# Patient Record
Sex: Female | Born: 1975 | Race: White | Hispanic: No | Marital: Married | Smoking: Never smoker
Health system: Southern US, Community
[De-identification: ages and names within clinical notes are randomized; demographics above are authoritative.]

## PROBLEM LIST (undated history)

## (undated) DIAGNOSIS — C439 Malignant melanoma of skin, unspecified: Secondary | ICD-10-CM

## (undated) DIAGNOSIS — F319 Bipolar disorder, unspecified: Secondary | ICD-10-CM

## (undated) DIAGNOSIS — E039 Hypothyroidism, unspecified: Secondary | ICD-10-CM

## (undated) HISTORY — PX: METATARSAL OSTEOTOMY WITH BUNIONECTOMY: SHX5662

## (undated) HISTORY — DX: Malignant melanoma of skin, unspecified: C43.9

---

## 1997-10-26 ENCOUNTER — Other Ambulatory Visit: Admission: RE | Admit: 1997-10-26 | Discharge: 1997-10-26 | Payer: Self-pay | Admitting: Gynecology

## 1998-11-16 ENCOUNTER — Other Ambulatory Visit: Admission: RE | Admit: 1998-11-16 | Discharge: 1998-11-16 | Payer: Self-pay | Admitting: Gynecology

## 1999-11-23 ENCOUNTER — Other Ambulatory Visit: Admission: RE | Admit: 1999-11-23 | Discharge: 1999-11-23 | Payer: Self-pay | Admitting: Gynecology

## 2000-12-03 ENCOUNTER — Other Ambulatory Visit: Admission: RE | Admit: 2000-12-03 | Discharge: 2000-12-03 | Payer: Self-pay | Admitting: Gynecology

## 2001-12-16 ENCOUNTER — Other Ambulatory Visit: Admission: RE | Admit: 2001-12-16 | Discharge: 2001-12-16 | Payer: Self-pay | Admitting: Gynecology

## 2002-11-10 ENCOUNTER — Other Ambulatory Visit: Admission: RE | Admit: 2002-11-10 | Discharge: 2002-11-10 | Payer: Self-pay | Admitting: Obstetrics and Gynecology

## 2003-11-24 ENCOUNTER — Other Ambulatory Visit: Admission: RE | Admit: 2003-11-24 | Discharge: 2003-11-24 | Payer: Self-pay | Admitting: Obstetrics and Gynecology

## 2004-01-19 ENCOUNTER — Ambulatory Visit: Payer: Self-pay | Admitting: Family Medicine

## 2004-04-04 ENCOUNTER — Ambulatory Visit: Payer: Self-pay | Admitting: Family Medicine

## 2005-01-04 ENCOUNTER — Other Ambulatory Visit: Admission: RE | Admit: 2005-01-04 | Discharge: 2005-01-04 | Payer: Self-pay | Admitting: Obstetrics and Gynecology

## 2006-01-12 ENCOUNTER — Ambulatory Visit: Payer: Self-pay | Admitting: Family Medicine

## 2006-10-04 ENCOUNTER — Ambulatory Visit (HOSPITAL_COMMUNITY): Admission: RE | Admit: 2006-10-04 | Discharge: 2006-10-04 | Payer: Self-pay | Admitting: Obstetrics and Gynecology

## 2007-03-12 ENCOUNTER — Telehealth: Payer: Self-pay | Admitting: Family Medicine

## 2007-10-27 ENCOUNTER — Inpatient Hospital Stay (HOSPITAL_COMMUNITY): Admission: AD | Admit: 2007-10-27 | Discharge: 2007-10-30 | Payer: Self-pay | Admitting: Obstetrics and Gynecology

## 2010-07-01 ENCOUNTER — Ambulatory Visit: Payer: Self-pay | Admitting: Podiatry

## 2010-10-25 LAB — CBC
HCT: 37
Hemoglobin: 12.7
Hemoglobin: 8.7 — ABNORMAL LOW
MCHC: 33.6
MCV: 95.8
RBC: 2.7 — ABNORMAL LOW
WBC: 15.9 — ABNORMAL HIGH

## 2015-03-17 ENCOUNTER — Ambulatory Visit: Payer: Self-pay | Admitting: Family Medicine

## 2015-03-25 DIAGNOSIS — F319 Bipolar disorder, unspecified: Secondary | ICD-10-CM | POA: Insufficient documentation

## 2015-03-26 DIAGNOSIS — R3129 Other microscopic hematuria: Secondary | ICD-10-CM | POA: Insufficient documentation

## 2015-11-02 ENCOUNTER — Encounter: Payer: Self-pay | Admitting: Podiatry

## 2015-11-02 ENCOUNTER — Ambulatory Visit (INDEPENDENT_AMBULATORY_CARE_PROVIDER_SITE_OTHER): Payer: BLUE CROSS/BLUE SHIELD | Admitting: Podiatry

## 2015-11-02 ENCOUNTER — Ambulatory Visit (INDEPENDENT_AMBULATORY_CARE_PROVIDER_SITE_OTHER): Payer: BLUE CROSS/BLUE SHIELD

## 2015-11-02 VITALS — BP 98/58 | HR 73 | Resp 16 | Ht 64.5 in | Wt 125.0 lb

## 2015-11-02 DIAGNOSIS — M25571 Pain in right ankle and joints of right foot: Secondary | ICD-10-CM

## 2015-11-02 DIAGNOSIS — S82891A Other fracture of right lower leg, initial encounter for closed fracture: Secondary | ICD-10-CM | POA: Diagnosis not present

## 2015-11-02 DIAGNOSIS — S8264XA Nondisplaced fracture of lateral malleolus of right fibula, initial encounter for closed fracture: Secondary | ICD-10-CM

## 2015-11-02 NOTE — Patient Instructions (Signed)
Ankle Fracture A fracture is a break in a bone. The ankle joint is made up of three bones. These include the lower (distal)sections of your lower leg bones, called the tibia and fibula, along with a bone in your foot, called the talus. Depending on how bad the break is and if more than one ankle joint bone is broken, a cast or splint is used to protect and keep your injured bone from moving while it heals. Sometimes, surgery is required to help the fracture heal properly.  There are two general types of fractures:  Stable fracture. This includes a single fracture line through one bone, with no injury to ankle ligaments. A fracture of the talus that does not have any displacement (movement of the bone on either side of the fracture line) is also stable.  Unstable fracture. This includes more than one fracture line through one or more bones in the ankle joint. It also includes fractures that have displacement of the bone on either side of the fracture line. CAUSES  A direct blow to the ankle.   Quickly and severely twisting your ankle.  Trauma, such as a car accident or falling from a significant height. RISK FACTORS You may be at a higher risk of ankle fracture if:  You have certain medical conditions.  You are involved in high-impact sports.  You are involved in a high-impact car accident. SIGNS AND SYMPTOMS   Tender and swollen ankle.  Bruising around the injured ankle.  Pain on movement of the ankle.  Difficulty walking or putting weight on the ankle.  A cold foot below the site of the ankle injury. This can occur if the blood vessels passing through your injured ankle were also damaged.  Numbness in the foot below the site of the ankle injury. DIAGNOSIS  An ankle fracture is usually diagnosed with a physical exam and X-rays. A CT scan may also be required for complex fractures. TREATMENT  Stable fractures are treated with a cast or splint and using crutches to avoid putting  weight on your injured ankle. This is followed by an ankle strengthening program. Some patients require a special type of cast, depending on other medical problems they may have. Unstable fractures require surgery to ensure the bones heal properly. Your health care provider will tell you what type of fracture you have and the best treatment for your condition. HOME CARE INSTRUCTIONS   Review correct crutch use with your health care provider and use your crutches as directed. Safe use of crutches is extremely important. Misuse of crutches can cause you to fall or cause injury to nerves in your hands or armpits.  Do not put weight or pressure on the injured ankle until directed by your health care provider.  To lessen the swelling, keep the injured leg elevated while sitting or lying down.  Apply ice to the injured area:  Put ice in a plastic bag.  Place a towel between your cast and the bag.  Leave the ice on for 20 minutes, 2-3 times a day.  If you have a plaster or fiberglass cast:  Do not try to scratch the skin under the cast with any objects. This can increase your risk of skin infection.  Check the skin around the cast every day. You may put lotion on any red or sore areas.  Keep your cast dry and clean.  If you have a plaster splint:  Wear the splint as directed.  You may loosen the elastic  around the splint if your toes become numb, tingle, or turn cold or blue.  Do not put pressure on any part of your cast or splint; it may break. Rest your cast only on a pillow the first 24 hours until it is fully hardened.  Your cast or splint can be protected during bathing with a plastic bag sealed to your skin with medical tape. Do not lower the cast or splint into water.  Take medicines as directed by your health care provider. Only take over-the-counter or prescription medicines for pain, discomfort, or fever as directed by your health care provider.  Do not drive a vehicle until  your health care provider specifically tells you it is safe to do so.  If your health care provider has given you a follow-up appointment, it is very important to keep that appointment. Not keeping the appointment could result in a chronic or permanent injury, pain, and disability. If you have any problem keeping the appointment, call the facility for assistance. SEEK MEDICAL CARE IF: You develop increased swelling or discomfort. SEEK IMMEDIATE MEDICAL CARE IF:   Your cast gets damaged or breaks.  You have continued severe pain.  You develop new pain or swelling after the cast was put on.  Your skin or toenails below the injury turn blue or gray.  Your skin or toenails below the injury feel cold, numb, or have loss of sensitivity to touch.  There is a bad smell or pus draining from under the cast. MAKE SURE YOU:   Understand these instructions.  Will watch your condition.  Will get help right away if you are not doing well or get worse.   This information is not intended to replace advice given to you by your health care provider. Make sure you discuss any questions you have with your health care provider.   Document Released: 01/07/2000 Document Revised: 01/14/2013 Document Reviewed: 08/08/2012 Elsevier Interactive Patient Education [employer].

## 2015-11-02 NOTE — Progress Notes (Signed)
   Subjective:    Patient ID: Robin Daniel, female    DOB: March 13, 1975, 40 y.o.   MRN: [id number]  HPI    Review of Systems  All other systems reviewed and are negative.      Objective:   Physical Exam        Assessment & Plan:

## 2015-11-02 NOTE — Progress Notes (Signed)
Patient ID: Robin Daniel, female   DOB: 1975-04-25, 40 y.o.   MRN: [id number] Subjective:  40 year old female with a history of orthostatic hypotension presents to the office today for right foot and ankle pain. Patient states that she got up off the couch this past Thursday at which time she had an episode of syncope and she fell down. As she fell she states that she heard a pop in her right ankle. Patient immediately noticed severe pain and tenderness with swelling to the right ankle. Patient presented to the [facility] at which time she was referred [facility] for [facility] treatment and evaluation    Objective/Physical Exam General: The patient is alert and oriented x3 in no acute distress.  Dermatology: Skin is warm, dry and supple bilateral lower extremities. Negative for open lesions or macerations.  Vascular: Palpable pedal pulses bilaterally. No edema or erythema noted. Capillary refill within normal limits.  Neurological: Epicritic and protective threshold grossly intact bilaterally.   Musculoskeletal Exam: Severe pain on palpation with ecchymosis and edema noted to the right ankle. Pain on palpation to the lateral aspect of the right ankle.  Radiographic Exam:  Positive for nondisplaced fibular fracture Danis Weber type B fracture to the right ankle.  All other joints and osseous structures are within normal limits  Assessment: #1 nondisplaced fibular fracture Danis Weber type B right ankle  #2 pain in right ankle  #3 edema right ankle   Plan of Care:  #1 Patient was evaluated. #2 instructed the patient today that she is to be strictly nonweightbearing for the next 6-8 weeks. Patient is also unable to drive due to ankle fracture on the right lower extremity. #3 today compressive Ace wrap was applied with cam boot. #4 patient is to return to clinic in 4 weeks   Dr. Edrick Kins, [place]

## 2015-11-03 ENCOUNTER — Telehealth: Payer: Self-pay | Admitting: *Deleted

## 2015-11-03 DIAGNOSIS — S82891A Other fracture of right lower leg, initial encounter for closed fracture: Secondary | ICD-10-CM

## 2015-11-03 NOTE — Telephone Encounter (Signed)
Pt states Dr. Amalia Hailey was ordering a knee scooter and walker for her use, what is the turn-around time in getting those. I spoke to pt and told her I would send the orders to [place] and they should contact to day with insurance coverage information.

## 2015-11-03 NOTE — Telephone Encounter (Signed)
Orders to [facility].

## 2015-11-15 ENCOUNTER — Telehealth: Payer: Self-pay | Admitting: Podiatry

## 2015-11-15 NOTE — Telephone Encounter (Signed)
Patient does not have to sleep in the boot. But please reaffirm that she must not bear weight on her foot when she is not in the boot.  - Dr. Amalia Hailey

## 2015-11-15 NOTE — Telephone Encounter (Signed)
Patient called said Dr. Amalia Hailey has been treating her for a broken Fibula and has her in a boot. She wants to know if she is supposed to sleep in this boot, or remove while sleeping?  Wants to know if a nurse or the doctor can call her back today. Please.. Thank you !

## 2015-11-16 NOTE — Telephone Encounter (Signed)
I informed pt of Dr. Amalia Hailey orders.

## 2015-11-30 ENCOUNTER — Ambulatory Visit (INDEPENDENT_AMBULATORY_CARE_PROVIDER_SITE_OTHER): Payer: BLUE CROSS/BLUE SHIELD

## 2015-11-30 ENCOUNTER — Ambulatory Visit (INDEPENDENT_AMBULATORY_CARE_PROVIDER_SITE_OTHER): Payer: BLUE CROSS/BLUE SHIELD | Admitting: Podiatry

## 2015-11-30 DIAGNOSIS — M25571 Pain in right ankle and joints of right foot: Secondary | ICD-10-CM

## 2015-11-30 DIAGNOSIS — S82891D Other fracture of right lower leg, subsequent encounter for closed fracture with routine healing: Secondary | ICD-10-CM

## 2015-11-30 MED ORDER — MELOXICAM 15 MG PO TABS: 15.0000 mg | ORAL_TABLET | Freq: Every day | ORAL | 1 refills | Status: AC

## 2015-12-01 ENCOUNTER — Telehealth: Payer: Self-pay | Admitting: Podiatry

## 2015-12-01 NOTE — Telephone Encounter (Signed)
Patient called at 8:04 am this morning requesting to pick up a CD of her x-rays in [place] today for a consultation with another physician.

## 2015-12-01 NOTE — Telephone Encounter (Signed)
I called the patient once I got in and settled at my desk to let her know that she would need to fill out and sign a medical records release form so she can pick up her CD of x-rays and that there would be a $5.00 charge. The patient did not answer her phone and I was unable to leave a message as her mailbox is full.

## 2015-12-05 NOTE — Progress Notes (Signed)
Subjective:  Patient presents for follow-up evaluation of a right ankle fibular fracture. Patient states that she is feeling much better. She states that she has not been walking on it and wearing the cam boot at all times except to sleep and. Patient presents today for follow-up evaluation.    Objective/Physical Exam General: The patient is alert and oriented x3 in no acute distress.  Dermatology: Skin is warm, dry and supple bilateral lower extremities. Negative for open lesions or macerations.  Vascular: Palpable pedal pulses bilaterally. No edema or erythema noted. Capillary refill within normal limits.  Neurological: Epicritic and protective threshold grossly intact bilaterally.   Musculoskeletal Exam: Range of motion within normal limits to all pedal and ankle joints bilateral. Muscle strength 5/5 in all groups bilateral.   Radiographic Exam:  Danis Weber B fibular ankle fracture, nondisplaced, closed with evidence of bone callus formation is routine healing.  Assessment: #1 nondisplaced fibular fracture Danis Weber type B right ankle-improving as expected #2 pain in right ankle #3 edema right ankle   Plan of Care:  #1 Patient was evaluated. #2 continue nonweightbearing in a cam boot. Patient does not have to sleep in the cam boot. Recommend daily compression. #3 prescription for meloxicam 15 mg #4 return to clinic in 2 weeks for follow-up x-rays   Dr. Edrick Kins, [place]

## 2015-12-14 ENCOUNTER — Ambulatory Visit: Payer: BLUE CROSS/BLUE SHIELD | Admitting: Podiatry

## 2016-02-10 ENCOUNTER — Encounter: Payer: Self-pay | Admitting: Emergency Medicine

## 2016-02-10 DIAGNOSIS — Y92009 Unspecified place in unspecified non-institutional (private) residence as the place of occurrence of the external cause: Secondary | ICD-10-CM | POA: Diagnosis not present

## 2016-02-10 DIAGNOSIS — Y999 Unspecified external cause status: Secondary | ICD-10-CM | POA: Insufficient documentation

## 2016-02-10 DIAGNOSIS — S39012A Strain of muscle, fascia and tendon of lower back, initial encounter: Secondary | ICD-10-CM | POA: Insufficient documentation

## 2016-02-10 DIAGNOSIS — Y9301 Activity, walking, marching and hiking: Secondary | ICD-10-CM | POA: Insufficient documentation

## 2016-02-10 DIAGNOSIS — S3992XA Unspecified injury of lower back, initial encounter: Secondary | ICD-10-CM | POA: Diagnosis present

## 2016-02-10 DIAGNOSIS — X509XXA Other and unspecified overexertion or strenuous movements or postures, initial encounter: Secondary | ICD-10-CM | POA: Diagnosis not present

## 2016-02-10 NOTE — ED Triage Notes (Signed)
Pt presents to ED via [facility]. States was hiking today behind her house when the back pain started. Pt states pain has progressively gotten worse to the point she couldn't stand anymore. Per EMS pt used TENS unit she had at home and had some relief. Pt states pain is worse with movement at this time, pt states sitting "hurts really bad". Pt is alert and oriented at this time.

## 2016-02-11 ENCOUNTER — Emergency Department
Admission: EM | Admit: 2016-02-11 | Discharge: 2016-02-11 | Disposition: A | Discharge status: Home / Self Care | Payer: BLUE CROSS/BLUE SHIELD | Attending: Emergency Medicine | Admitting: Emergency Medicine

## 2016-02-11 ENCOUNTER — Encounter: Payer: Self-pay | Admitting: Emergency Medicine

## 2016-02-11 DIAGNOSIS — T148XXA Other injury of unspecified body region, initial encounter: Secondary | ICD-10-CM

## 2016-02-11 DIAGNOSIS — M545 Low back pain, unspecified: Secondary | ICD-10-CM

## 2016-02-11 HISTORY — DX: Bipolar disorder, unspecified: F31.9

## 2016-02-11 MED ORDER — KETOROLAC TROMETHAMINE 30 MG/ML IJ SOLN
15.0000 mg | Freq: Once | INTRAMUSCULAR | Status: AC
Start: 2016-02-11 — End: 2016-02-11
  Administered 2016-02-11: 15 mg via INTRAMUSCULAR
  Filled 2016-02-11: qty 1

## 2016-02-11 MED ORDER — HYDROCODONE-ACETAMINOPHEN 5-325 MG PO TABS
2.0000 | ORAL_TABLET | Freq: Once | ORAL | Status: AC
  Administered 2016-02-11: 2 via ORAL
  Filled 2016-02-11: qty 2

## 2016-02-11 MED ORDER — CYCLOBENZAPRINE HCL 10 MG PO TABS: 10.0000 mg | ORAL_TABLET | Freq: Once | ORAL | Status: DC

## 2016-02-11 MED ORDER — LIDOCAINE 5 % EX PTCH
1.0000 | MEDICATED_PATCH | CUTANEOUS | Status: DC
  Administered 2016-02-11: 1 via TRANSDERMAL
  Filled 2016-02-11: qty 1

## 2016-02-11 MED ORDER — LIDOCAINE 5 % EX PTCH: 1.0000 | MEDICATED_PATCH | Freq: Two times a day (BID) | CUTANEOUS | 0 refills | Status: AC

## 2016-02-11 MED ORDER — CYCLOBENZAPRINE HCL 5 MG PO TABS: 5.0000 mg | ORAL_TABLET | Freq: Three times a day (TID) | ORAL | 0 refills | Status: DC | PRN

## 2016-02-11 MED ORDER — CYCLOBENZAPRINE HCL 10 MG PO TABS
5.0000 mg | ORAL_TABLET | Freq: Once | ORAL | Status: AC
  Administered 2016-02-11: 5 mg via ORAL
  Filled 2016-02-11: qty 1

## 2016-02-11 NOTE — Discharge Instructions (Signed)
You have been seen in the Emergency Department (ED)  today for back pain.  Your workup and exam have not shown any acute abnormalities and you are likely suffering from muscle strain or possible problems with your discs, but there is no treatment that will fix your symptoms at this time.  Please take Motrin (ibuprofen) as needed for your pain according to the instructions written on the box.  Alternatively, for the next five days you can take 616m three times daily with meals (it may upset your stomach).  Every 6 hours you can take 10070mof Tylenol.  Please use the prescribed medications as instructed.  Continue to use ice/cold packs if this helps your back feel better.  Please follow up with your doctor as soon as possible regarding today's ED visit and your back pain.  Return to the ED for worsening back pain, fever, weakness or numbness of either leg, or if you develop either (1) an inability to urinate or have bowel movements, or (2) loss of your ability to control your bathroom functions (if you start having "accidents"), or if you develop other new symptoms that concern you.

## 2016-02-11 NOTE — ED Provider Notes (Signed)
[facility] Emergency Department Provider Note  ____________________________________________   First MD Initiated Contact with Patient 02/11/16 0155     (approximate)  I have reviewed the triage vital signs and the nursing notes.   HISTORY  Chief Complaint Back Pain    HPI Robin Daniel is a 41 y.o. female with no significant chronic medical history who presents for evaluation of gradually worsening low back pain over the course of the day today.  We had a great deal of snow over the last couple of days and she reports that she was outside playing in the snow yesterday and again today.  She noticed some discomfort in her lower back earlier today but it did not limit her activity and she was quite active with hiking, climbing, and navigating in the woods and up and down some slopes.  After she got back to the house she was feeling a great deal of sharp pain in her lower back that made it difficult to ambulate and to bend over to remove her boots.The pain has been gradually worsening since then and is severe, worse with movement and better with rest.  She has had no urinary difficulties and is able to void without any problems except for the pain caused by sitting on the commode.  She has no numbness or tingling in a saddle distribution nor extending down her legs.  She had no specific accident or trauma and has no pain directly over the bones of her lower back.  She has not been feeling ill recently and denies chest pain, shortness of breath, nausea, vomiting, abdominal pain, dysuria.   Past Medical History:  Diagnosis Date  . Bipolar disorder ([place])     There are no active problems to display for this patient.   History reviewed. No pertinent surgical history.  Prior to Admission medications   Medication Sig Start Date End Date Taking? Authorizing Provider  buPROPion (WELLBUTRIN SR) 150 MG 12 hr tablet Take 150 mg by mouth 2 (two) times daily. 10/20/15    Historical Provider, MD  cyclobenzaprine (FLEXERIL) 5 MG tablet Take 1 tablet (5 mg total) by mouth 3 (three) times daily as needed for muscle spasms. 02/11/16   Hinda Kehr, MD  EQUETRO 300 MG CP12 Take 3 capsules by mouth at bedtime. 10/11/15   Historical Provider, MD  lamoTRIgine (LAMICTAL) 200 MG tablet  10/27/15   Historical Provider, MD  lidocaine (LIDODERM) 5 % Place 1 patch onto the skin every 12 (twelve) hours. Remove & Discard patch within 12 hours or as directed by MD.  Pershing Proud the patch off for 12 hours before applying a new one. 02/11/16 02/10/17  Hinda Kehr, MD  naltrexone (DEPADE) 50 MG tablet Take by mouth.    Historical Provider, MD    Allergies Patient has no known allergies.  History reviewed. No pertinent family history.  Social History Social History  Substance Use Topics  . Smoking status: Never Smoker  . Smokeless tobacco: Never Used  . Alcohol use No    Review of Systems Constitutional: No fever/chills Eyes: No visual changes. ENT: No sore throat. Cardiovascular: Denies chest pain. Respiratory: Denies shortness of breath. Gastrointestinal: No abdominal pain.  No nausea, no vomiting.  No diarrhea.  No constipation. Genitourinary: Negative for dysuria. No difficulty voiding. Musculoskeletal: Pain in the lower back Skin: Negative for rash. Neurological: Negative for headaches, focal weakness or numbness.  10-point ROS otherwise negative.  ____________________________________________   PHYSICAL EXAM:  VITAL SIGNS: ED  Triage Vitals  Enc Vitals Group     BP 02/10/16 2252 113/70     Pulse Rate 02/10/16 2252 91     Resp 02/10/16 2252 18     Temp 02/10/16 2252 98.1 F (36.7 C)     Temp Source 02/10/16 2252 Oral     SpO2 02/10/16 2252 97 %     Weight 02/10/16 2251 123 lb (55.8 kg)     Height 02/10/16 2251 5' 5"  (1.651 m)     Head Circumference --      Peak Flow --      Pain Score 02/10/16 2252 8     Pain Loc --      Pain Edu? --      Excl. in [place]? --      Constitutional: Alert and oriented. Well appearing and in no acute distress. Eyes: Conjunctivae are normal. PERRL. EOMI. Head: Atraumatic. Nose: No congestion/rhinnorhea. Mouth/Throat: Mucous membranes are moist.  Oropharynx non-erythematous. Neck: No stridor.  No meningeal signs.   Cardiovascular: Normal rate, regular rhythm. Good peripheral circulation. Grossly normal heart sounds. Respiratory: Normal respiratory effort.  No retractions. Lungs CTAB. Gastrointestinal: Soft and nontender. No distention.  Musculoskeletal: The patient has no bony spinal tenderness to palpation throughout her spine including in the lumbar/sacral region.  She does have soft tissue tenderness most notable to the right of the spine but all of her paraspinal muscles are tender.  There is no evidence of abscess or infection. No lower extremity tenderness nor edema. No gross deformities of extremities. Neurologic:  Normal speech and language. No gross focal neurologic deficits are appreciated.  Skin:  Skin is warm, dry and intact. No rash noted. Psychiatric: Mood and affect are normal. Speech and behavior are normal.  ____________________________________________   LABS (all labs ordered are listed, but only abnormal results are displayed)  Labs Reviewed - No data to display ____________________________________________  EKG  None - EKG not ordered by ED physician ____________________________________________  RADIOLOGY   No results found.  ____________________________________________   PROCEDURES  Procedure(s) performed:   Procedures   Critical Care performed: No ____________________________________________   INITIAL IMPRESSION / ASSESSMENT AND PLAN / ED COURSE  Pertinent labs & imaging results that were available during my care of the patient were reviewed by me and considered in my medical decision making (see chart for details).  The patient has soft tissue tenderness of the lower  back consistent with musculoskeletal strain after exerting herself today and yesterday.  She has no neurological deficits and there is nothing to suggest cauda equina syndrome, spinal epidural abscess, or acute fracture or other surgical emergency.  She is hemodynamically stable and generally healthy with a normal body habitus.  I had an extensive conversation with her and her sister about musculoskeletal pain, how radiographs are not really indicated in this particular case given the very low probability of a bony injury, and how we will attempt to manage her pain.  She has received Percocet and I will give her a Lidoderm patch, Toradol 50 mg intramuscular injection, and a Flexeril.  I also encouraged her to continue to use ice packs or heating pads on her back (which ever feels more comfortable).  I anticipate discharge with outpatient follow-up.  She and her sister understand and agree with the plan.   Clinical Course as of Feb 10 410  Fri Feb 11, 2016  0357 Patient feels much better after her treatments.  Was able to go to the commode with assistance from  her sister.  Reiterated all my advice, management recommendations, and return precautions.  [CF]    Clinical Course User Index [CF] Hinda Kehr, MD    ____________________________________________  FINAL CLINICAL IMPRESSION(S) / ED DIAGNOSES  Final diagnoses:  Acute right-sided low back pain without sciatica  Muscle strain     MEDICATIONS GIVEN DURING THIS VISIT:  Medications  lidocaine (LIDODERM) 5 % 1 patch (1 patch Transdermal Patch Applied 02/11/16 0242)  HYDROcodone-acetaminophen (NORCO/VICODIN) 5-325 MG per tablet 2 tablet (2 tablets Oral Given 02/11/16 0136)  ketorolac (TORADOL) 30 MG/ML injection 15 mg (15 mg Intramuscular Given 02/11/16 0241)  cyclobenzaprine (FLEXERIL) tablet 5 mg (5 mg Oral Given 02/11/16 0241)     NEW OUTPATIENT MEDICATIONS STARTED DURING THIS VISIT:  New Prescriptions   CYCLOBENZAPRINE (FLEXERIL) 5 MG  TABLET    Take 1 tablet (5 mg total) by mouth 3 (three) times daily as needed for muscle spasms.   LIDOCAINE (LIDODERM) 5 %    Place 1 patch onto the skin every 12 (twelve) hours. Remove & Discard patch within 12 hours or as directed by MD.  Pershing Proud the patch off for 12 hours before applying a new one.    Modified Medications   No medications on file    Discontinued Medications   No medications on file     Note:  This document was prepared using Dragon voice recognition software and may include unintentional dictation errors.    Hinda Kehr, MD 02/11/16 [phone]

## 2016-02-11 NOTE — ED Notes (Signed)
ED Provider at bedside. 

## 2016-08-11 ENCOUNTER — Other Ambulatory Visit: Payer: Self-pay | Admitting: Obstetrics and Gynecology

## 2016-08-11 DIAGNOSIS — R928 Other abnormal and inconclusive findings on diagnostic imaging of breast: Secondary | ICD-10-CM

## 2016-08-21 ENCOUNTER — Ambulatory Visit
Admission: RE | Admit: 2016-08-21 | Discharge: 2016-08-21 | Disposition: A | Discharge status: Home / Self Care | Payer: BLUE CROSS/BLUE SHIELD | Source: Ambulatory Visit | Attending: Obstetrics and Gynecology | Admitting: Obstetrics and Gynecology

## 2016-08-21 ENCOUNTER — Other Ambulatory Visit: Payer: Self-pay | Admitting: Obstetrics and Gynecology

## 2016-08-21 DIAGNOSIS — R921 Mammographic calcification found on diagnostic imaging of breast: Secondary | ICD-10-CM

## 2016-08-21 DIAGNOSIS — R928 Other abnormal and inconclusive findings on diagnostic imaging of breast: Secondary | ICD-10-CM

## 2017-02-22 ENCOUNTER — Ambulatory Visit
Admission: RE | Admit: 2017-02-22 | Discharge: 2017-02-22 | Disposition: A | Discharge status: Home / Self Care | Payer: BLUE CROSS/BLUE SHIELD | Source: Ambulatory Visit | Attending: Obstetrics and Gynecology | Admitting: Obstetrics and Gynecology

## 2017-02-22 ENCOUNTER — Other Ambulatory Visit: Payer: Self-pay | Admitting: Obstetrics and Gynecology

## 2017-02-22 DIAGNOSIS — R921 Mammographic calcification found on diagnostic imaging of breast: Secondary | ICD-10-CM

## 2017-09-03 ENCOUNTER — Ambulatory Visit
Admission: RE | Admit: 2017-09-03 | Discharge: 2017-09-03 | Disposition: A | Discharge status: Home / Self Care | Payer: BLUE CROSS/BLUE SHIELD | Source: Ambulatory Visit | Attending: Obstetrics and Gynecology | Admitting: Obstetrics and Gynecology

## 2017-09-03 DIAGNOSIS — R921 Mammographic calcification found on diagnostic imaging of breast: Secondary | ICD-10-CM

## 2017-11-05 ENCOUNTER — Ambulatory Visit: Payer: Self-pay | Admitting: Psychiatry

## 2017-11-08 ENCOUNTER — Ambulatory Visit: Payer: BLUE CROSS/BLUE SHIELD | Admitting: Psychiatry

## 2017-11-08 ENCOUNTER — Encounter: Payer: Self-pay | Admitting: Psychiatry

## 2017-11-08 DIAGNOSIS — F3181 Bipolar II disorder: Secondary | ICD-10-CM

## 2017-11-08 NOTE — Progress Notes (Signed)
Robin Daniel [id number] 09/13/1975 42 y.o. 30 minutes Subjective:   Patient ID:  Robin Daniel is a 42 y.o. (DOB 1975/07/22) female.  Chief Complaint:  Chief Complaint  Patient presents with  . Medication Problem    Cerefolin NAC caused acne  . Medication Reaction    more dizzy  . Follow-up    bipolar    HPI Robin Daniel presents to the office today for follow-up of bipolar 2 and c/o dizzinesss, nausea, vomiting together first thing in am before taking meds. On and off since Equetro and lamictal.   Has to lay down to prevent vomiting.  Lasts an hour if she goes to sleep and may last longer.  But only happens once weekly.  Never later in the day.  Change to lamotrigine XR from IR made no difference.  Mood has been pretty good overall.  Sleep ok Pt reports that mood is Euthymic and describes anxiety as Minimal. Anxiety symptoms include: Excessive Worry, over health with recent labs. Pt reports no sleep issues. Pt reports that appetite is good and weight decrease by 5lbs. Cut down on carbs. Pt reports that energy is good and good. Concentration is good. Suicidal thoughts:  denied by patient. No wt gain off naltrexone.  Medications: Scheduled: Continuous: PRN:  buPROPion (WELLBUTRIN SR) 150 MG 12 hr tablet 150 mg, BH-each morning       cyclobenzaprine (FLEXERIL) 5 MG tablet 5 mg, 3 times daily PRN       Patient not taking. Reported on 11/08/2017     EQUETRO 300 MG CP12 3 capsule, Daily at bedtime            LamoTRIgine 200 MG TB24 24 hour tablet 1 tablet, 2 times daily       levothyroxine (SYNTHROID, LEVOTHROID) 50 MCG tablet 1 tablet, BH-each morning            zolpidem (AMBIEN) 10 MG tablet 1 tablet, At bedtime PRN        Medication Side Effects: Nausea and Other: vomiting ? related  Allergies: No Known Allergies  Past Medical History:  Diagnosis Date  . Bipolar disorder ([place])     Family History  Problem Relation Age of Onset  . Breast cancer Neg Hx      Social History   Socioeconomic History  . Marital status: Married    Spouse name: Not on file  . Number of children: Not on file  . Years of education: Not on file  . Highest education level: Not on file  Occupational History  . Not on file  Social Needs  . Financial resource strain: Not on file  . Food insecurity:    Worry: Not on file    Inability: Not on file  . Transportation needs:    Medical: Not on file    Non-medical: Not on file  Tobacco Use  . Smoking status: Never Smoker  . Smokeless tobacco: Never Used  Substance and Sexual Activity  . Alcohol use: No  . Drug use: No  . Sexual activity: Not on file  Lifestyle  . Physical activity:    Days per week: Not on file    Minutes per session: Not on file  . Stress: Not on file  Relationships  . Social connections:    Talks on phone: Not on file    Gets together: Not on file    Attends religious service: Not on file    Active member of club or organization: Not on  file    Attends meetings of clubs or organizations: Not on file    Relationship status: Not on file  . Intimate partner violence:    Fear of current or ex partner: Not on file    Emotionally abused: Not on file    Physically abused: Not on file    Forced sexual activity: Not on file  Other Topics Concern  . Not on file  Social History Narrative  . Not on file    Past Medical History, Surgical history, Social history, and Family history were reviewed and updated as appropriate.   Please see review of systems for further details on the patient's review from today.   Review of Systems:  Review of Systems  Musculoskeletal: Negative for arthralgias and back pain.  Neurological: Positive for dizziness and light-headedness.  Psychiatric/Behavioral: Positive for decreased concentration and hallucinations. Negative for behavioral problems, confusion, self-injury and suicidal ideas.  She is having heavy menstrual periods and frequent  once.  Objective:   Physical Exam:  There were no vitals taken for this visit.  Physical Exam  Psychiatric: Judgment normal. Her mood appears anxious. Her affect is not angry and not inappropriate. Her speech is rapid and/or pressured. Her speech is not slurred. She is not agitated, not hyperactive and not slowed. Thought content is not paranoid. Cognition and memory are normal. She expresses no homicidal and no suicidal ideation. She is inattentive.    Lab Review:  No results found for: NA, K, CL, CO2, GLUCOSE, BUN, CREATININE, CALCIUM, PROT, ALBUMIN, AST, ALT, ALKPHOS, BILITOT, GFRNONAA, GFRAA     Component Value Date/Time   WBC 16.2 (H) 10/29/2007 0505   RBC 2.70 (L) 10/29/2007 0505   HGB 8.7 DELTA CHECK NOTED (L) 10/29/2007 0505   HCT 25.9 (L) 10/29/2007 0505   PLT 183 10/29/2007 0505   MCV 95.8 10/29/2007 0505   MCHC 33.6 10/29/2007 0505   RDW 14.8 10/29/2007 0505         Assessment: Plan:    Bipolar II disorder ([place]) - Plan: Carbamazepine level, total  Please see After Visit Summary for patient specific instructions.  Future Appointments  Date Time Provider [place]  12/13/2017 11:30 AM Cottle, Billey Co., MD [facility] None    Orders Placed This Encounter  Procedures  . Carbamazepine level, total       Greater than 50% of face to face time with patient was spent on counseling and coordination of care. We discussed possible causes of her episodes of dizziness nausea and vomiting.  She is seeing another physician to have this evaluated she has some laboratory abnormalities noted.  However it is also quite possible that the symptoms are related to carbamazepine as this is not an uncommon side effect.  She has tolerated this before without this problem.  But has lost weight.  We will check carbamazepine blood level.  Discussed how to do a trough level.  Also suggest that she adjust timing of the carbamazepine dosages to see if it makes a difference in her  symptoms since her symptoms only occur in the morning.  Chief complaint of side effects related to the B vitamins and Cerefolin NAC but did have cognitive improvement from it.  Once her physical symptoms are resolved we should initiate N-acetylcysteine for the cognitive reasons that it was added before.  Discussed her abnormal labs which she will follow-up with her other physician.  It may be related to her heavy menstrual periods.  We will see her back  in 4 weeks.  She will let us know if her physical exam findings from the other physician are abnormal. -------------------------------

## 2017-11-10 LAB — CARBAMAZEPINE LEVEL, TOTAL: Carbamazepine (Tegretol), S: 11.1 ug/mL (ref 4.0–12.0)

## 2017-11-12 ENCOUNTER — Telehealth: Payer: Self-pay | Admitting: Psychiatry

## 2017-11-12 NOTE — Telephone Encounter (Signed)
RTC: She had questions re: why the CBZ level is unchanged but she's having dizziness, NV now and wasn't before. Still possible.   PCP wonders if CBC abnormality was likely a result of the carbamazepine. Disc her microcytic anemia and recommendation to have PCP check iron studies again if not done.   Still trial reduce Equetro to 300 BID.  Call if increase mood px. She agrees.   Lynder Parents MD

## 2017-11-12 NOTE — Progress Notes (Signed)
Left voicemail to call back with lab results

## 2017-11-12 NOTE — Progress Notes (Signed)
Given information but has a lot of questions, stating her carbamazepine level was the same when it was checked last. Also said her PCP had sent her a message about her follow up labs wanting to know if you can see those as well. She didn't know what the labs were or what was off but her doctor said it could be related to her carbamazepine level. She thinks its related to her tolerance of the medication. Would like a call back.

## 2017-11-25 ENCOUNTER — Encounter: Payer: Self-pay | Admitting: Emergency Medicine

## 2017-11-25 DIAGNOSIS — F3181 Bipolar II disorder: Secondary | ICD-10-CM

## 2017-11-25 DIAGNOSIS — F429 Obsessive-compulsive disorder, unspecified: Secondary | ICD-10-CM | POA: Insufficient documentation

## 2017-12-10 ENCOUNTER — Other Ambulatory Visit: Payer: Self-pay

## 2017-12-10 MED ORDER — LAMOTRIGINE 200 MG PO TABS: 200.0000 mg | ORAL_TABLET | Freq: Two times a day (BID) | ORAL | 1 refills | Status: DC

## 2017-12-13 ENCOUNTER — Ambulatory Visit: Payer: BLUE CROSS/BLUE SHIELD | Admitting: Psychiatry

## 2017-12-24 ENCOUNTER — Ambulatory Visit: Payer: BLUE CROSS/BLUE SHIELD | Admitting: Psychiatry

## 2018-01-07 ENCOUNTER — Ambulatory Visit: Payer: BLUE CROSS/BLUE SHIELD | Admitting: Psychiatry

## 2018-01-07 ENCOUNTER — Encounter: Payer: Self-pay | Admitting: Psychiatry

## 2018-01-07 DIAGNOSIS — F5105 Insomnia due to other mental disorder: Secondary | ICD-10-CM | POA: Diagnosis not present

## 2018-01-07 DIAGNOSIS — F422 Mixed obsessional thoughts and acts: Secondary | ICD-10-CM | POA: Diagnosis not present

## 2018-01-07 DIAGNOSIS — F3181 Bipolar II disorder: Secondary | ICD-10-CM | POA: Diagnosis not present

## 2018-01-07 MED ORDER — ALPRAZOLAM 1 MG PO TABS: 1.0000 mg | ORAL_TABLET | Freq: Every day | ORAL | 2 refills | Status: DC

## 2018-01-07 MED ORDER — BUPROPION HCL ER (SR) 150 MG PO TB12: 150.0000 mg | ORAL_TABLET | ORAL | 1 refills | Status: DC

## 2018-01-07 MED ORDER — LAMOTRIGINE 200 MG PO TABS
200.0000 mg | ORAL_TABLET | Freq: Two times a day (BID) | ORAL | 1 refills | Status: DC
Start: 2018-01-07 — End: 2018-03-27

## 2018-01-07 MED ORDER — ZOLPIDEM TARTRATE 10 MG PO TABS: 5.0000 mg | ORAL_TABLET | Freq: Every evening | ORAL | 2 refills | Status: DC | PRN

## 2018-01-07 NOTE — Progress Notes (Signed)
Rema P Lilley [id number] [birth year] 42 y.o.  Subjective:   Patient ID:  Robin Daniel is a 42 y.o. (DOB Jul 04, 1975) female.  Chief Complaint:  Chief Complaint  Patient presents with  . Follow-up    Medication Management    HPI Robin Daniel presents to the office today for follow-up of bipolar disorder.  Dropped Equetro dropped the SE of Equetro.  Change made Oct 21.  Mood has been stable so far.Marland Kitchen Residual anxiety is manageable.  Previous blood level of carbamazepine was 11.1 on 900 mg daily.  Her weight loss may have driven that level up.  So far she has done well with the lower dose.  The side effects of resolved.  Recently not sleeping great and alternates ambien and Xanax.  Somewhat cyclical.  Review of Systems:  Review of Systems  Neurological: Negative for tremors and weakness.  Psychiatric/Behavioral: Negative for agitation, behavioral problems, confusion, decreased concentration, dysphoric mood, hallucinations, self-injury, sleep disturbance and suicidal ideas. The patient is not nervous/anxious and is not hyperactive.     Medications: I have reviewed the patient's current medications.  Current Outpatient Medications  Medication Sig Dispense Refill  . cyclobenzaprine (FLEXERIL) 5 MG tablet Take 1 tablet (5 mg total) by mouth 3 (three) times daily as needed for muscle spasms. 30 tablet 0  . EQUETRO 300 MG CP12 Take 1 capsule by mouth 2 (two) times daily at 8 am and 10 pm.   4  . levothyroxine (SYNTHROID, LEVOTHROID) 50 MCG tablet Take 1 tablet by mouth every morning.  5  . lidocaine (LIDODERM) 5 % Place onto the skin as needed.    . ALPRAZolam (XANAX) 1 MG tablet Take 1 tablet (1 mg total) by mouth daily. 30 tablet 2  . buPROPion (WELLBUTRIN SR) 150 MG 12 hr tablet Take 1 tablet (150 mg total) by mouth every morning. 90 tablet 1  . lamoTRIgine (LAMICTAL) 200 MG tablet Take 1 tablet (200 mg total) by mouth 2 (two) times daily. 180 tablet 1  . zolpidem (AMBIEN) 10 MG  tablet Take 0.5-1 tablets (5-10 mg total) by mouth at bedtime as needed. 30 tablet 2   No current facility-administered medications for this visit.     Medication Side Effects: None  Allergies: No Known Allergies  Past Medical History:  Diagnosis Date  . Bipolar disorder ([place])     Family History  Problem Relation Age of Onset  . Breast cancer Neg Hx     Social History   Socioeconomic History  . Marital status: Married    Spouse name: Not on file  . Number of children: Not on file  . Years of education: Not on file  . Highest education level: Not on file  Occupational History  . Not on file  Social Needs  . Financial resource strain: Not on file  . Food insecurity:    Worry: Not on file    Inability: Not on file  . Transportation needs:    Medical: Not on file    Non-medical: Not on file  Tobacco Use  . Smoking status: Never Smoker  . Smokeless tobacco: Never Used  Substance and Sexual Activity  . Alcohol use: No  . Drug use: No  . Sexual activity: Not on file  Lifestyle  . Physical activity:    Days per week: Not on file    Minutes per session: Not on file  . Stress: Not on file  Relationships  . Social connections:    Talks on  phone: Not on file    Gets together: Not on file    Attends religious service: Not on file    Active member of club or organization: Not on file    Attends meetings of clubs or organizations: Not on file    Relationship status: Not on file  . Intimate partner violence:    Fear of current or ex partner: Not on file    Emotionally abused: Not on file    Physically abused: Not on file    Forced sexual activity: Not on file  Other Topics Concern  . Not on file  Social History Narrative  . Not on file    Past Medical History, Surgical history, Social history, and Family history were reviewed and updated as appropriate.   Please see review of systems for further details on the patient's review from today.   Objective:    Physical Exam:  There were no vitals taken for this visit.  Physical Exam Constitutional:      General: She is not in acute distress.    Appearance: She is well-developed.  Musculoskeletal:        General: No deformity.  Neurological:     Mental Status: She is alert and oriented to person, place, and time.     Motor: No tremor.     Coordination: Coordination normal.     Gait: Gait normal.  Psychiatric:        Attention and Perception: Attention and perception normal.        Mood and Affect: Mood is not anxious or depressed. Affect is not labile, blunt, angry or inappropriate.        Speech: Speech normal.        Behavior: Behavior normal.        Thought Content: Thought content normal. Thought content does not include homicidal or suicidal ideation. Thought content does not include homicidal or suicidal plan.        Cognition and Memory: Cognition normal.        Judgment: Judgment normal.     Comments: Insight intact. No auditory or visual hallucinations. No delusions.      Lab Review:  No results found for: NA, K, CL, CO2, GLUCOSE, BUN, CREATININE, CALCIUM, PROT, ALBUMIN, AST, ALT, ALKPHOS, BILITOT, GFRNONAA, GFRAA     Component Value Date/Time   WBC 16.2 (H) 10/29/2007 0505   RBC 2.70 (L) 10/29/2007 0505   HGB 8.7 DELTA CHECK NOTED (L) 10/29/2007 0505   HCT 25.9 (L) 10/29/2007 0505   PLT 183 10/29/2007 0505   MCV 95.8 10/29/2007 0505   MCHC 33.6 10/29/2007 0505   RDW 14.8 10/29/2007 0505    No results found for: POCLITH, LITHIUM   Lab Results  Component Value Date   CBMZ 11.1 11/09/2017   on 965m daily  .res Assessment: Plan:    Bipolar II disorder ([place]  Mixed obsessional thoughts and acts  Insomnia due to mental condition   Discussed the risk of increased mood cycling with reduction in carbamazepine but her blood level on the lower dose should be adequate to maintain stability.  She has lost some weight over time.  Sleep is managed but we discussed  the risk of Ambien causing amnesia especially at 10 mg a day.  Try to get by with 5 mg a day when she uses Ambien Ambien.    We discussed the short-term risks associated with benzodiazepines including sedation and increased fall risk among others.  Discussed long-term side effect risk  including dependence, potential withdrawal symptoms, and the potential eventual dose-related risk of dementia.  Follow-up 3 months or earlier if she starts having mood swings  Lynder Parents, MD, DFAPA  Please see After Visit Summary for patient specific instructions.  No future appointments.  No orders of the defined types were placed in this encounter.     -------------------------------

## 2018-03-11 ENCOUNTER — Telehealth: Payer: Self-pay | Admitting: Psychiatry

## 2018-03-11 NOTE — Telephone Encounter (Signed)
See office visit from 01/07/2018

## 2018-03-11 NOTE — Telephone Encounter (Signed)
Doesn't want to increase that much due to how sick she was, going to try to add 100 mg at bedtime (has samples) for a few days to see if that helps. Agreed and instructed to call back with s/s.

## 2018-03-11 NOTE — Telephone Encounter (Signed)
Robin Daniel called to report that she is not doing well since decrease in her medicine.  Feels she is just going "bonkers".  Can she increase her equetro?  Or do you have other suggestions.  She doesn't have an appt. Until 3/19.  Please call.

## 2018-03-11 NOTE — Telephone Encounter (Signed)
Increase equetro to 1 in am and 2 at night.  If SE recur then less [place] know and we'll make adjustments using a combination of long acting and short-acting CBZ.  Lynder Parents, MD, DFAPA

## 2018-03-13 ENCOUNTER — Encounter: Payer: Self-pay | Admitting: Psychiatry

## 2018-03-13 ENCOUNTER — Ambulatory Visit (INDEPENDENT_AMBULATORY_CARE_PROVIDER_SITE_OTHER): Payer: PRIVATE HEALTH INSURANCE | Admitting: Psychiatry

## 2018-03-13 DIAGNOSIS — F422 Mixed obsessional thoughts and acts: Secondary | ICD-10-CM | POA: Diagnosis not present

## 2018-03-13 NOTE — Progress Notes (Signed)
      [facility] Counselor/Therapist Progress Note  Patient ID: Wende MYKIAH SCHMUCK, MRN: [id number],    Date: 03/13/2018  Time Spent: 51 minutes  Treatment Type: Individual Therapy  Reported Symptoms: Intrusive thoughts, anxiety, chronic rumination.  Mental Status Exam:  Appearance:   Well Groomed     Behavior:  Appropriate  Motor:  Normal  Speech/Language:   Clear and Coherent  Affect:  Appropriate  Mood:  anxious  Thought process:  normal  Thought content:    WNL  Sensory/Perceptual disturbances:    WNL  Orientation:  oriented to person, place, time/date and situation  Attention:  Good  Concentration:  Good  Memory:  WNL  Fund of knowledge:   Good  Insight:    Good  Judgment:   Good  Impulse Control:  Good   Risk Assessment: Danger to Self:  No Self-injurious Behavior: No Danger to Others: No Duty to Warn:no Physical Aggression / Violence:No  Access to Firearms a concern: No  Gang Involvement:No   Subjective: The client states, "I thought I was having a mental breakdown the other day."  She reports in September her meds were causing her to have vertigo and nausea.  The client is very petite and at the time have lost 6 to 7 pounds.  Dr. Clovis Pu assessed that the drop in her weight change the dynamic of her meds.  He reduced her Equetro by one third and her vertigo and nausea went away. Most recently she has had intrusive thoughts and chronic rumination about lice.  She is fearful of an infestation with her family or in her house.  She finds herself constantly checking her hair and checking her son.  Today I used EMDR with the client to help her reduce the impact of the intrusive thoughts.  Her subjective units of distress went from a 5+ to less than 1 at the end of the session.  We discussed the nature of lice i.e. they are in insect.  She thought that they were black and I explained that they were a grade 2 and off-white.  I also noted to the client that they are larger than  she thinks they are a much easier to spot.  The further we got into this the more she realized that this was not something that would be overwhelming.  Her positive cognition at the end of the session was 'I am smart enough to manage this."  Interventions: Solution-Oriented/Positive Psychology, Eye Movement Desensitization and Reprocessing (EMDR) and Insight-Oriented  Diagnosis:   ICD-10-CM   1. Mixed obsessional thoughts and acts F42.2     Plan: Positive self talk, lipid supplementation including and also tall, follow-up with Dr. Clovis Pu.  Albertina Parr Davaughn [place], [place]

## 2018-03-26 ENCOUNTER — Ambulatory Visit: Payer: PRIVATE HEALTH INSURANCE | Admitting: Psychiatry

## 2018-03-27 ENCOUNTER — Ambulatory Visit: Payer: PRIVATE HEALTH INSURANCE | Admitting: Psychiatry

## 2018-03-27 ENCOUNTER — Encounter: Payer: Self-pay | Admitting: Psychiatry

## 2018-03-27 DIAGNOSIS — F5105 Insomnia due to other mental disorder: Secondary | ICD-10-CM | POA: Diagnosis not present

## 2018-03-27 DIAGNOSIS — F3181 Bipolar II disorder: Secondary | ICD-10-CM

## 2018-03-27 DIAGNOSIS — F422 Mixed obsessional thoughts and acts: Secondary | ICD-10-CM | POA: Diagnosis not present

## 2018-03-27 MED ORDER — EQUETRO 300 MG PO CP12: 1.0000 | ORAL_CAPSULE | Freq: Two times a day (BID) | ORAL | 4 refills | Status: DC

## 2018-03-27 MED ORDER — BUPROPION HCL ER (SR) 150 MG PO TB12: 150.0000 mg | ORAL_TABLET | ORAL | 1 refills | Status: DC

## 2018-03-27 MED ORDER — CARBAMAZEPINE 100 MG PO CHEW: 300.0000 mg | CHEWABLE_TABLET | Freq: Every day | ORAL | 1 refills | Status: DC

## 2018-03-27 MED ORDER — LAMOTRIGINE 200 MG PO TABS: 200.0000 mg | ORAL_TABLET | Freq: Two times a day (BID) | ORAL | 1 refills | Status: DC

## 2018-03-27 MED ORDER — ALPRAZOLAM 1 MG PO TABS: 1.0000 mg | ORAL_TABLET | Freq: Every day | ORAL | 2 refills | Status: DC

## 2018-03-27 MED ORDER — ZOLPIDEM TARTRATE 10 MG PO TABS: 5.0000 mg | ORAL_TABLET | Freq: Every evening | ORAL | 2 refills | Status: DC | PRN

## 2018-03-27 NOTE — Progress Notes (Signed)
Robin Daniel [id number] 1975-06-12 43 y.o.  Subjective:   Patient ID:  Robin Daniel is a 43 y.o. (DOB November 02, 1975) female.  Chief Complaint:  Chief Complaint  Patient presents with  . Follow-up    Medication Management   Last seen December HPI Robin Daniel presents to the office today for follow-up of   About 5-6 weeks ago worse with obsessive thoughts even over things that don't make sense, like lice fears.  Getting up in the middle of the night.  Called and increased the Equetro to 700 bc 800 or more causes health issues, N/V and dizziness.  A little better but not well.  Struggling with OC thoughts.  Not feeling hyper nor driven.  Is irritable for several weeks, agitated. Trouble sleeping and taking 39m Xanax nightly and that gives 8 hours.  Works better than Ambien.  Fear of insomnia.  Currently Equetro 300 Am and 3-400 at night.   Review of Systems:  Review of Systems  Neurological: Negative for tremors and weakness.  Psychiatric/Behavioral: Negative for agitation, behavioral problems, confusion, decreased concentration, dysphoric mood, hallucinations, self-injury, sleep disturbance and suicidal ideas. The patient is not nervous/anxious and is not hyperactive.     Medications: I have reviewed the patient's current medications.  Current Outpatient Medications  Medication Sig Dispense Refill  . ALPRAZolam (XANAX) 1 MG tablet Take 1 tablet (1 mg total) by mouth daily. 30 tablet 2  . buPROPion (WELLBUTRIN SR) 150 MG 12 hr tablet Take 1 tablet (150 mg total) by mouth every morning. 90 tablet 1  . cyclobenzaprine (FLEXERIL) 5 MG tablet Take 1 tablet (5 mg total) by mouth 3 (three) times daily as needed for muscle spasms. 30 tablet 0  . EQUETRO 300 MG CP12 Take 1 capsule (300 mg total) by mouth 2 (two) times daily at 8 am and 10 pm. 3021min the am and 400 Pm 60 each 4  . lamoTRIgine (LAMICTAL) 200 MG tablet Take 1 tablet (200 mg total) by mouth 2 (two) times daily. 180  tablet 1  . levothyroxine (SYNTHROID, LEVOTHROID) 50 MCG tablet Take 1 tablet by mouth every morning.  5  . lidocaine (LIDODERM) 5 % Place onto the skin as needed.    . zolpidem (AMBIEN) 10 MG tablet Take 0.5-1 tablets (5-10 mg total) by mouth at bedtime as needed. 30 tablet 2  . carbamazepine (TEGRETOL) 100 MG chewable tablet Chew 3 tablets (300 mg total) by mouth at bedtime. 90 tablet 1   No current facility-administered medications for this visit.     Medication Side Effects: None  Allergies: No Known Allergies  Past Medical History:  Diagnosis Date  . [facility] disorder ([facility]    Family History  Problem Relation Age of Onset  . Breast cancer Neg Hx     Social History   Socioeconomic History  . Marital status: Married    Spouse name: Not on file  . Number of children: Not on file  . Years of education: Not on file  . Highest education level: Not on file  Occupational History  . Not on file  Social Needs  . Financial resource strain: Not on file  . Food insecurity:    Worry: Not on file    Inability: Not on file  . Transportation needs:    Medical: Not on file    Non-medical: Not on file  Tobacco Use  . Smoking status: Never Smoker  . Smokeless tobacco: Never Used  Substance and Sexual Activity  .  Alcohol use: No  . Drug use: No  . Sexual activity: Not on file  Lifestyle  . Physical activity:    Days per week: Not on file    Minutes per session: Not on file  . Stress: Not on file  Relationships  . Social connections:    Talks on phone: Not on file    Gets together: Not on file    Attends religious service: Not on file    Active member of club or organization: Not on file    Attends meetings of clubs or organizations: Not on file    Relationship status: Not on file  . Intimate partner violence:    Fear of current or ex partner: Not on file    Emotionally abused: Not on file    Physically abused: Not on file    Forced sexual activity: Not on file  Other  Topics Concern  . Not on file  Social History Narrative  . Not on file    Past Medical History, Surgical history, Social history, and Family history were reviewed and updated as appropriate.   Please see review of systems for further details on the patient's review from today.   Objective:   Physical Exam:  There were no vitals taken for this visit.  Physical Exam Constitutional:      General: She is not in acute distress.    Appearance: She is well-developed.  Musculoskeletal:        General: No deformity.  Neurological:     Mental Status: She is alert and oriented to person, place, and time.     Coordination: Coordination normal.  Psychiatric:        Attention and Perception: Attention normal. She is attentive.        Mood and Affect: Mood is anxious. Mood is not depressed. Affect is not labile, blunt, angry or inappropriate.        Speech: Speech normal.        Behavior: Behavior normal.        Thought Content: Thought content does not include homicidal or suicidal ideation.        Cognition and Memory: Cognition normal.        Judgment: Judgment normal.     Comments: Insight is good.  Mood is anxious and irritable. Intrusive obsessive thoughts and racing thoughts.     Lab Review:  No results found for: NA, K, CL, CO2, GLUCOSE, BUN, CREATININE, CALCIUM, PROT, ALBUMIN, AST, ALT, ALKPHOS, BILITOT, GFRNONAA, GFRAA     Component Value Date/Time   WBC 16.2 (H) 10/29/2007 0505   RBC 2.70 (L) 10/29/2007 0505   HGB 8.7 DELTA CHECK NOTED (L) 10/29/2007 0505   HCT 25.9 (L) 10/29/2007 0505   PLT 183 10/29/2007 0505   MCV 95.8 10/29/2007 0505   MCHC 33.6 10/29/2007 0505   RDW 14.8 10/29/2007 0505    No results found for: POCLITH, LITHIUM   Lab Results  Component Value Date   CBMZ 11.1 11/09/2017     .res Assessment: Plan:    Mixed obsessional thoughts and acts  Bipolar II disorder ([place])  Insomnia due to mental condition   Greater than 50% of face to face  time with patient was spent on counseling and coordination of care. We discussed Patient was stable on 900 mg of Equetro for an extended period of time.  However she eventually started developing side effects of dizziness and nausea and vomiting and could no longer tolerate that dosage.  The  dosage was cut back to 600 mg and she has subsequently developed a mixture of obsessive-compulsive and irritable hypomanic symptoms.  She is having unusual intrusive obsessive thoughts.  It is possible that she may tolerate short acting carbamazepine given at bedtime to supplement the long-acting carbamazepine that she is currently taking.  Add short-acting CBZ in hopes she will not have daytime SE from it.  Carbamazepine immediate release 100 mg 1 nightly for 1 week, then 2 nightly for 1 week, then 3 nightly if tolerated.  This will put her back to her prior dose that was effective in controlling symptoms.  We discussed the short-term risks associated with benzodiazepines including sedation and increased fall risk among others.  Discussed long-term side effect risk including dependence, potential withdrawal symptoms, and the potential eventual dose-related risk of dementia.  This appt was 30 mins.  FU 3 weeks.  Lynder Parents, MD, DFAPA   Please see After Visit Summary for patient specific instructions.  Future Appointments  Date Time Provider [place]  04/24/2018 10:30 AM Cottle, Billey Co., MD CP-CP None  05/01/2018  3:00 PM May, [place], [facility] None    No orders of the defined types were placed in this encounter.     -------------------------------

## 2018-03-27 NOTE — Patient Instructions (Signed)
Add 1 carbamazepine tablet at night for 1 week and then if needed add another for a week and finally if needed add a third tablet at night.

## 2018-03-29 ENCOUNTER — Telehealth: Payer: Self-pay | Admitting: Psychiatry

## 2018-03-29 NOTE — Telephone Encounter (Signed)
Verified instructions with pt. Instructed to call back with anymore questions or concerns.

## 2018-03-29 NOTE — Telephone Encounter (Signed)
Patient called and said that she is confused about how much equetro and carbamanazapine to take . She is afraid that it is over 900 mg.Her after visit summary says one is short acting and one is long acting. Please leave a detailed message on her phone [phone]

## 2018-03-29 NOTE — Telephone Encounter (Signed)
That is correct on the Equetro 1 twice daily and to that she was supposed to short-acting carbamazapine as follows: Carbamazepine immediate release 100 mg 1 nightly for 1 week, then 2 nightly for 1 week, then 3 nightly if tolerated.  This will put her back to her prior dose that was effective in controlling symptoms.

## 2018-03-29 NOTE — Telephone Encounter (Signed)
Prior authorization submitted for Equetro bid #60, approved x 1 year. If I need to change quantity let me know.

## 2018-04-01 ENCOUNTER — Telehealth: Payer: Self-pay

## 2018-04-01 NOTE — Telephone Encounter (Signed)
Prior authorization submitted for equetro SR 363m capsule, approved effective 03/29/2018-03/28/2019  [facility] [address] BLorina Rabonfaxed approval [phone]

## 2018-04-05 ENCOUNTER — Telehealth: Payer: Self-pay | Admitting: Psychiatry

## 2018-04-05 MED ORDER — CARBAMAZEPINE ER 100 MG PO TB12: 300.0000 mg | ORAL_TABLET | Freq: Two times a day (BID) | ORAL | 0 refills | Status: DC

## 2018-04-05 NOTE — Telephone Encounter (Signed)
RTC  OK with 800  CBZ now.  Equetro 300 BID.    Disc SE risk with the switch to CBZ XR bc she had problems tolerating the Equetro 300 AM and 600 PM.  Option switch if she wants to try the generic and try it and understands the prior SE may recur.    She wants to try.  Therefore switch the morning dose from Equetro 300 mg every morning to carbamazepine ER 100 mg tablets 3 every morning and continue the evening Equetro 300 mg plus carbamazepine immediate release 200 mg at bedtime for 4 to 5 days.  If well tolerated, then switch all Equetro 300 mg capsules to carbamazepine ER 100 mg tablets 3 every morning and 3 nightly.  She agrees with the plan.  Lynder Parents, MD, DFAPA

## 2018-04-05 NOTE — Telephone Encounter (Signed)
Pt has been getting Robin Daniel, but now with new insurance it costs $185/month. This is very costly for her and wants to see about alternatives.

## 2018-04-08 ENCOUNTER — Telehealth: Payer: Self-pay | Admitting: Psychiatry

## 2018-04-08 NOTE — Telephone Encounter (Signed)
Note    These were the instructions I gave her on Friday.  Switch the morning dose of Equetro 300 mg every morning to carbamazepine ER 100 mg tablets, 3 every morning and continue the evening Equetro 300 mg plus carbamazepine immediate release 200 mg at bedtime for 4 to 5 days.  If well tolerated, then switch all Equetro 300 mg capsules to carbamazepine ER 100 mg tablets 3 every morning and 3 nightly. The evening 2 of the 100 mg carbamazepine chewable tablets continues unchanged.  The only med changing is Equetro ER to carbamazepine ER.  The bupropion, and short acting carbamazepine 100 mg chewable tablets and lamotrigine are unchanged.  Regarding the carbamazepine, the end result will be: Carbamazepine ER 100 mg tablets, 3 in the morning and 3 at night.   Carbamazepine 100 mg chewable tablets, 2 at night  Lynder Parents, MD, DFAPA

## 2018-04-08 NOTE — Telephone Encounter (Signed)
Patient called and said that she wants to go over the directions for the medicine to make sure she understands exactly how to take it. She takes the wellbutrin in the am then take 3 tabs of the carbaonazapine which totals 344m.At lunch she takes the lamictal 207mat night she takes another lamictal 20056man equatro  300m49md 2 carbonazapine equals 200 mg. After 4 or 5 days switch equatro to the carbanazapine. She wants to know if is it regular carbonazapine or there carbonazapine? In the end will they all be er status.

## 2018-04-08 NOTE — Telephone Encounter (Signed)
Clarified instructions with pt. Verbalized understanding but may call back again. Reassured her that's no problem.

## 2018-04-08 NOTE — Telephone Encounter (Signed)
These were the instructions I gave her on Friday.  Therefore switch the morning dose from Equetro 300 mg every morning to carbamazepine ER 100 mg tablets 3 every morning and continue the evening Equetro 300 mg plus carbamazepine immediate release 200 mg at bedtime for 4 to 5 days.  If well tolerated, then switch all Equetro 300 mg capsules to carbamazepine ER 100 mg tablets 3 every morning and 3 nightly.  She agrees with the plan.   The only med changing is Equetro ER to carbamazepine ER.  The bupropion, and short acting carbamazepine 100 mg chewable tablets and lamotrigine are unchanged.  Regarding the carbamazepine, the end result will be: Carbamazepine ER 100 mg tablets, 3 in the morning and 3 at night.   Carbamazepine 100 mg chewable tablets, 2 at night  Lynder Parents, MD, DFAPA

## 2018-04-11 ENCOUNTER — Ambulatory Visit: Payer: BLUE CROSS/BLUE SHIELD | Admitting: Psychiatry

## 2018-04-21 ENCOUNTER — Other Ambulatory Visit: Payer: Self-pay | Admitting: Psychiatry

## 2018-04-24 ENCOUNTER — Other Ambulatory Visit: Payer: Self-pay

## 2018-04-24 ENCOUNTER — Ambulatory Visit (INDEPENDENT_AMBULATORY_CARE_PROVIDER_SITE_OTHER): Payer: PRIVATE HEALTH INSURANCE | Admitting: Psychiatry

## 2018-04-24 ENCOUNTER — Encounter: Payer: Self-pay | Admitting: Psychiatry

## 2018-04-24 DIAGNOSIS — F422 Mixed obsessional thoughts and acts: Secondary | ICD-10-CM | POA: Diagnosis not present

## 2018-04-24 DIAGNOSIS — F5105 Insomnia due to other mental disorder: Secondary | ICD-10-CM | POA: Diagnosis not present

## 2018-04-24 DIAGNOSIS — F3181 Bipolar II disorder: Secondary | ICD-10-CM | POA: Diagnosis not present

## 2018-04-24 DIAGNOSIS — F411 Generalized anxiety disorder: Secondary | ICD-10-CM

## 2018-04-24 MED ORDER — CARBAMAZEPINE 100 MG PO CHEW: 300.0000 mg | CHEWABLE_TABLET | Freq: Every day | ORAL | 0 refills | Status: DC

## 2018-04-24 MED ORDER — CARBAMAZEPINE ER 100 MG PO TB12: 300.0000 mg | ORAL_TABLET | Freq: Two times a day (BID) | ORAL | 0 refills | Status: DC

## 2018-04-24 MED ORDER — ALPRAZOLAM 1 MG PO TABS: ORAL_TABLET | ORAL | 2 refills | Status: DC

## 2018-04-24 NOTE — Progress Notes (Signed)
Robin Daniel [id number] 1975-10-30 43 y.o.  Subjective:   Patient ID:  Robin Daniel is a 43 y.o. (DOB 1975/12/24) female.  Chief Complaint:  Chief Complaint  Patient presents with  . Follow-up    med changes  . Medication Problem    HPI  Robin Daniel phone office visit today because of the Covid virus.  She expressed concern about difficulty affording Equetro and wanting to try to switch to a cheaper generic.  This is complicated because she has not tolerated carbamazepine very well and this change could very well make it even less tolerable.  She also has had a good response to the full dose of Equetro.  Tolerated switch to generic without problems.Patient reports stable mood and denies depressed or irritable moods.  Patient some recent difficulty with anxiety over the Covid.  Really stressed out.  Patient denies difficulty with sleep initiation or maintenance. Denies appetite disturbance.  Patient reports that energy and motivation have been good.  Patient denies any difficulty with concentration.  Patient denies any suicidal ideation. Also is irritalble and anxious with period.    Alternating ambien and Xanax for sleep to help reduce dependence risks.  About 5-6 weeks ago worse with obsessive thoughts even over things that don't make sense, like lice fears, resolved but now Covid.  Getting up in the middle of the night.  Called and increased the Equetro to 700 bc 800 or more causes health issues, N/V and dizziness.  A little better but not well.  Struggling with OC thoughts.  Not feeling hyper nor driven.  Is irritable for several weeks, agitated. Trouble sleeping and taking 18m Xanax nightly and that gives 8 hours.  Works better than Ambien.  Fear of insomnia.     Review of Systems:  Review of Systems  Neurological: Negative for tremors and weakness.  Psychiatric/Behavioral: Negative for agitation, behavioral problems, confusion, decreased concentration, dysphoric mood,  hallucinations, self-injury, sleep disturbance and suicidal ideas. The patient is not nervous/anxious and is not hyperactive.     Medications: I have reviewed the patient's current medications.  Current Outpatient Medications  Medication Sig Dispense Refill  . ALPRAZolam (XANAX) 1 MG tablet Take 1 tablet (1 mg total) by mouth daily. (Patient taking differently: Take 1 mg by mouth at bedtime. ) 30 tablet 2  . buPROPion (WELLBUTRIN SR) 150 MG 12 hr tablet Take 1 tablet (150 mg total) by mouth every morning. 90 tablet 1  . carbamazepine (TEGRETOL) 100 MG chewable tablet Chew 3 tablets (300 mg total) by mouth at bedtime. (Patient taking differently: Chew 200 mg by mouth at bedtime. ) 90 tablet 1  . carbamazepine (TEGRETOL-XR) 100 MG 12 hr tablet Take 3 tablets (300 mg total) by mouth 2 (two) times daily. 180 tablet 0  . lamoTRIgine (LAMICTAL) 200 MG tablet Take 1 tablet (200 mg total) by mouth 2 (two) times daily. 180 tablet 1  . levothyroxine (SYNTHROID, LEVOTHROID) 50 MCG tablet Take 1 tablet by mouth every morning.  5  . lidocaine (LIDODERM) 5 % Place onto the skin as needed.    . zolpidem (AMBIEN) 10 MG tablet Take 0.5-1 tablets (5-10 mg total) by mouth at bedtime as needed. 30 tablet 2  . EQUETRO 300 MG CP12 Take 1 capsule (300 mg total) by mouth 2 (two) times daily at 8 am and 10 pm. 3045min the am and 400 Pm (Patient not taking: Reported on 04/24/2018) 60 each 4   No current facility-administered medications for this  visit.     Medication Side Effects: None  Allergies: No Known Allergies  Past Medical History:  Diagnosis Date  . Bipolar disorder ([place])     Family History  Problem Relation Age of Onset  . Breast cancer Neg Hx     Social History   Socioeconomic History  . Marital status: Married    Spouse name: Not on file  . Number of children: Not on file  . Years of education: Not on file  . Highest education level: Not on file  Occupational History  . Not on file   Social Needs  . Financial resource strain: Not on file  . Food insecurity:    Worry: Not on file    Inability: Not on file  . Transportation needs:    Medical: Not on file    Non-medical: Not on file  Tobacco Use  . Smoking status: Never Smoker  . Smokeless tobacco: Never Used  Substance and Sexual Activity  . Alcohol use: No  . Drug use: No  . Sexual activity: Not on file  Lifestyle  . Physical activity:    Days per week: Not on file    Minutes per session: Not on file  . Stress: Not on file  Relationships  . Social connections:    Talks on phone: Not on file    Gets together: Not on file    Attends religious service: Not on file    Active member of club or organization: Not on file    Attends meetings of clubs or organizations: Not on file    Relationship status: Not on file  . Intimate partner violence:    Fear of current or ex partner: Not on file    Emotionally abused: Not on file    Physically abused: Not on file    Forced sexual activity: Not on file  Other Topics Concern  . Not on file  Social History Narrative  . Not on file    Past Medical History, Surgical history, Social history, and Family history were reviewed and updated as appropriate.   Please see review of systems for further details on the patient's review from today.   Objective:   Physical Exam:  There were no vitals taken for this visit.  Physical Exam Constitutional:      General: She is not in acute distress.    Appearance: She is well-developed.  Musculoskeletal:        General: No deformity.  Neurological:     Mental Status: She is alert and oriented to person, place, and time.     Coordination: Coordination normal.  Psychiatric:        Attention and Perception: Attention normal. She is attentive.        Mood and Affect: Mood is anxious. Mood is not depressed. Affect is not labile, blunt, angry or inappropriate.        Speech: Speech normal.        Behavior: Behavior normal.         Thought Content: Thought content does not include homicidal or suicidal ideation.        Cognition and Memory: Cognition normal.        Judgment: Judgment normal.     Comments: Insight is good.  Mood is anxious and irritable. Intrusive obsessive thoughts and racing thoughts.     Lab Review:  No results found for: NA, K, CL, CO2, GLUCOSE, BUN, CREATININE, CALCIUM, PROT, ALBUMIN, AST, ALT, ALKPHOS, BILITOT, GFRNONAA, GFRAA  Component Value Date/Time   WBC 16.2 (H) 10/29/2007 0505   RBC 2.70 (L) 10/29/2007 0505   HGB 8.7 DELTA CHECK NOTED (L) 10/29/2007 0505   HCT 25.9 (L) 10/29/2007 0505   PLT 183 10/29/2007 0505   MCV 95.8 10/29/2007 0505   MCHC 33.6 10/29/2007 0505   RDW 14.8 10/29/2007 0505    No results found for: POCLITH, LITHIUM   Lab Results  Component Value Date   CBMZ 11.1 11/09/2017     .res Assessment: Plan:    Bipolar II disorder ([place])  Mixed obsessional thoughts and acts  Generalized anxiety disorder  Insomnia due to mental condition   Greater than 50% of face to face time with patient was spent on counseling and coordination of care. We discussed Patient was stable on 900 mg of Equetro for an extended period of time.  However she eventually started developing side effects of dizziness and nausea and vomiting and could no longer tolerate that dosage.  The dosage was cut back to 600 mg and she has subsequently developed a mixture of obsessive-compulsive and irritable hypomanic symptoms.  She is having unusual intrusive obsessive thoughts which were about lice and now is about Covid.  She has been successful at transitioning from [facility] to a mixture of immediate release and extended release carbamazepine.  She is not having nausea and vomiting and she is not having as much daytime sleepiness.  If she starts having more mood instability or if her anxiety does not get better we may try adding another short acting carbamazepine at night to get her back to a  total of 900 mg which was her previous ideal dose.  Continue carbamazepine ER 100 mg tablets 3 twice daily and carbamazepine 100 mg 2 at night.  I am leaving the prescription written for 3 of the short acting carbamazepine at night because we may have to go back up in the dose.  Answered her questions about carbamazepine and long-term health risks.  Continue the Wellbutrin SR 150 mg in the morning as she feels that is helpful for energy and appetite control.  And somewhat for attention and focus.  Continue alternating Xanax and Ambien at night but is also taking it prn for anxiety.  I have okayed number 45/month  We discussed the short-term risks associated with benzodiazepines including sedation and increased fall risk among others.  Discussed long-term side effect risk including dependence, potential withdrawal symptoms, and the potential eventual dose-related risk of dementia.  This appt was 30 mins.  I connected with patient by a video enabled telemedicine application or telephone, with their informed consent, and verified patient privacy and that I am speaking with the correct person using two identifiers.  I was located at office and patient at home.  FU 8 weeks.  Lynder Parents, MD, DFAPA   Please see After Visit Summary for patient specific instructions.  Future Appointments  Date Time Provider [place]  05/01/2018  3:00 PM May, [place], [facility] None    No orders of the defined types were placed in this encounter.     -------------------------------

## 2018-04-25 ENCOUNTER — Telehealth: Payer: Self-pay

## 2018-04-25 NOTE — Telephone Encounter (Signed)
Prior authorization submitted fo Carbamazepine ER 137m 3 tablets bid through [employer] approved effective for #180 30 days.

## 2018-05-01 ENCOUNTER — Ambulatory Visit: Payer: BLUE CROSS/BLUE SHIELD | Admitting: Psychiatry

## 2018-05-01 ENCOUNTER — Encounter

## 2018-05-11 ENCOUNTER — Other Ambulatory Visit: Payer: Self-pay | Admitting: Psychiatry

## 2018-05-13 ENCOUNTER — Other Ambulatory Visit: Payer: Self-pay | Admitting: Psychiatry

## 2018-05-13 ENCOUNTER — Telehealth: Payer: Self-pay | Admitting: Psychiatry

## 2018-05-13 MED ORDER — CARBAMAZEPINE 100 MG PO CHEW: 200.0000 mg | CHEWABLE_TABLET | Freq: Every day | ORAL | 0 refills | Status: DC

## 2018-05-13 MED ORDER — CARBAMAZEPINE ER 100 MG PO TB12: ORAL_TABLET | ORAL | 0 refills | Status: DC

## 2018-05-13 NOTE — Telephone Encounter (Signed)
She is correct that was the goal because she and the most mood stability at this dose.  Prescription was sent and at this dosage.

## 2018-05-13 NOTE — Progress Notes (Signed)
Patient is back up to 900 mg total of carbamazepine taking carbamazepine XR 100 mg tablets 3 in the morning and 4 at night plus carbamazepine immediate release 100 mg tablets 2 at night for a total of 900 mg.  This was the goal because she had the most stability at this dose.

## 2018-05-13 NOTE — Telephone Encounter (Signed)
Pt called requested refill Carbamazepine XR 100 mg. She needs 3 morning and 4 at bedtime not 3 at bedtime. She is now back up to 900 mg total with regular Carbamazepine 2 at bedtime Pharm sending request but not correct dose for nighttime. [facility]

## 2018-05-14 ENCOUNTER — Other Ambulatory Visit: Payer: Self-pay | Admitting: Psychiatry

## 2018-05-15 ENCOUNTER — Other Ambulatory Visit: Payer: Self-pay | Admitting: Psychiatry

## 2018-05-15 MED ORDER — CARBAMAZEPINE ER 200 MG PO TB12: ORAL_TABLET | ORAL | 1 refills | Status: DC

## 2018-05-15 MED ORDER — CARBAMAZEPINE ER 100 MG PO TB12: 100.0000 mg | ORAL_TABLET | ORAL | 1 refills | Status: DC

## 2018-05-15 NOTE — Telephone Encounter (Signed)
Ok to send 90 day, with new changes?

## 2018-05-15 NOTE — Telephone Encounter (Signed)
Please explain this to the patient because it is going to be confusing.  The insurance required the following:  Patient's insurance would not pay for Tegretol XR 100 mg tablets 3 in the morning and 4 at night because of quantity limits.  Therefore the prescription was rewritten Tegretol-XR 100 mg tablets 1 in the morning #30 and 1 refill and Tegretol-XR 200 mg tablets 1 in the morning and 2 at night #90 and 1 refill.  This will need to be explained to the patient in detail because she is also taking short acting carbamazepine 100 mg tablets 2 at night.  I apologize that she requires 3 different prescriptions to achieve the correct dose but this is a complication of her insurance limitations  Lynder Parents MD, DFAPA

## 2018-05-15 NOTE — Telephone Encounter (Signed)
I got a fax from yesterday that said they were not going to improve more than 4/day.  Have you heard differently?

## 2018-05-15 NOTE — Progress Notes (Signed)
Patient's insurance would not pay for Tegretol X are 100 mg tablets 3 in the morning and 4 at night because of quantity limits.  Therefore the prescription was rewritten Tegretol-XR 100 mg tablets 1 in the morning #30 and 1 refill and Tegretol-XR 200 mg tablets 1 in the morning and 2 at night #90 and 1 refill.  This will need to be explained to the patient in detail because she is also taking short acting carbamazepine 100 mg tablets 2 at night.  Lynder Parents MD, DFAPA

## 2018-05-15 NOTE — Telephone Encounter (Signed)
The PA on 04/27/2018 approved 6/day. The PA I sent yesterday denied 7/day  Not sure where the 4 came in?

## 2018-05-16 NOTE — Telephone Encounter (Signed)
She verbalized understanding and actually is pleased about the changes because she will be taking less tablets at one time.

## 2018-06-06 ENCOUNTER — Encounter: Payer: Self-pay | Admitting: Psychiatry

## 2018-06-06 ENCOUNTER — Other Ambulatory Visit: Payer: Self-pay

## 2018-06-06 ENCOUNTER — Ambulatory Visit (INDEPENDENT_AMBULATORY_CARE_PROVIDER_SITE_OTHER): Payer: PRIVATE HEALTH INSURANCE | Admitting: Psychiatry

## 2018-06-06 DIAGNOSIS — F411 Generalized anxiety disorder: Secondary | ICD-10-CM | POA: Diagnosis not present

## 2018-06-06 DIAGNOSIS — F3181 Bipolar II disorder: Secondary | ICD-10-CM | POA: Diagnosis not present

## 2018-06-06 DIAGNOSIS — F5105 Insomnia due to other mental disorder: Secondary | ICD-10-CM

## 2018-06-06 DIAGNOSIS — F422 Mixed obsessional thoughts and acts: Secondary | ICD-10-CM | POA: Diagnosis not present

## 2018-06-06 NOTE — Progress Notes (Signed)
Robin Daniel [id number] 1975-06-19 43 y.o.   Virtual Visit via Telephone Note  I connected with pt by telephone and verified that I am speaking with the correct person using two identifiers.   I discussed the limitations, risks, security and privacy concerns of performing an evaluation and management service by telephone and the availability of in person appointments. I also discussed with the patient that there may be a patient responsible charge related to this service. The patient expressed understanding and agreed to proceed.  I discussed the assessment and treatment plan with the patient. The patient was provided an opportunity to ask questions and all were answered. The patient agreed with the plan and demonstrated an understanding of the instructions.   The patient was advised to call back or seek an in-person evaluation if the symptoms worsen or if the condition fails to improve as anticipated.  I provided 40 minutes of non-face-to-face time during this encounter. The call started at 1030 and ended at 1110. The patient was located at home and the provider was located office.   Subjective:   Patient ID:  Robin Daniel is a 43 y.o. (DOB [birth year]) female.  Chief Complaint:  Chief Complaint  Patient presents with  . Follow-up    Medication Management  . Depression    Medication Management  . Anxiety    Medication Management    Depression         Associated symptoms include no decreased concentration and no suicidal ideas.  Past medical history includes anxiety.   Anxiety  Patient reports no confusion, decreased concentration, nervous/anxious behavior or suicidal ideas.     Robin Daniel phone office visit today because of the Covid virus.  Follow-up med changes and mood.  Last visit was Robin Daniel, Robin Daniel.  She is taking an unusual mix between long-acting and short acting carbamazepine in order to achieve maximum mood benefit but avoid side effects of nausea and  dizziness without this combination.  Patient's insurance would not pay for Tegretol X are 100 mg tablets 3 in the morning and 4 at night because of quantity limits.  Therefore the prescription was rewritten Tegretol-XR 100 mg tablets Daniel in the morning #30 and Daniel refill and Tegretol-XR 200 mg tablets Daniel in the morning and 2 at night #90 and Daniel refill.  This will need to be explained to the patient in detail because she is also taking short acting carbamazepine 100 mg tablets 2 at night.  Been doing really good for the most part.  Less obsessive improved a lot but is a huge improvement.  Knows she needs more therapy and tends to obsess over things from the past.  Could tell a huge difference moving from 800 mg to 900 mg.   I need to stay at 900 mg CBZ.  Some fear of longterms SE.  She also has had a good response to the full dose of Equetro.  Tolerated switch to generic without problems.Patient reports stable mood and denies depressed or irritable moods.  Patient denies difficulty with sleep initiation or maintenance. Still struggles with chronic anxiety and negative thoughts about herself.  Much less obsessing on Covid.  Denies appetite disturbance.  Patient reports that energy and motivation have been good.  Patient denies any difficulty with concentration.  Patient denies any suicidal ideation. Also is irritalble and anxious with period.    Alternating ambien and Xanax for sleep to help reduce dependence risks.  Usually sleeps better with Xanax.  Trouble sleeping and taking 33m Xanax nightly and that gives 8 hours.  Works better than Ambien.  Fear of insomnia.    Past Psychiatric Medication Trials: Carbamazepine 900, lamotrigine XR 200 mg twice daily, Wellbutrin SR 100 mg twice daily, naltrexone, Abilify, sertraline, stimulants caused mania, Trintellix, alprazolam, Ambien, N-acetylcysteine, topiramate, paroxetine weakness, other antidepressants  Review of Systems: No tremor no weakness no nausea  vomiting no dizziness.  Medications: I have reviewed the patient's current medications.  Current Outpatient Medications  Medication Sig Dispense Refill  . ALPRAZolam (XANAX) Daniel MG tablet Daniel tablet at night and one half daily as needed for anxiety 45 tablet 2  . bimatoprost (LATISSE) 0.03 % ophthalmic solution PLACE Daniel DROP ON APPLICATOR & APPLY EVENLY AT THE BASE OF EYELASHES NIGHTLY ON BOTH EYES.    .Marland KitchenbuPROPion (WELLBUTRIN SR) 150 MG 12 hr tablet Take Daniel tablet (150 mg total) by mouth every morning. 90 tablet Daniel  . carbamazepine (TEGRETOL XR) 200 MG 12 hr tablet Daniel in the morning and 2 at night 90 tablet Daniel  . carbamazepine (TEGRETOL) 100 MG chewable tablet Chew 2 tablets (200 mg total) by mouth at bedtime. (Patient taking differently: Chew 200 mg by mouth 2 (two) times daily. ) 180 tablet 0  . lamoTRIgine (LAMICTAL) 200 MG tablet Take Daniel tablet (200 mg total) by mouth 2 (two) times daily. 180 tablet Daniel  . levothyroxine (SYNTHROID, LEVOTHROID) 50 MCG tablet Take Daniel tablet by mouth every morning.  5  . zolpidem (AMBIEN) 10 MG tablet Take 0.5-Daniel tablets (5-10 mg total) by mouth at bedtime as needed. 30 tablet 2  . carbamazepine (TEGRETOL-XR) 100 MG 12 hr tablet Take Daniel tablet (100 mg total) by mouth every morning. (Patient not taking: Reported on 5/14/Robin Daniel) 30 tablet Daniel   No current facility-administered medications for this visit.     Medication Side Effects: None no longer dizziness, nausea  Allergies: No Known Allergies  Past Medical History:  Diagnosis Date  . Bipolar disorder ([place]     Family History  Problem Relation Age of Onset  . Breast cancer Neg Hx     Social History   Socioeconomic History  . Marital status: Married    Spouse name: Not on file  . Number of children: Not on file  . Years of education: Not on file  . Highest education level: Not on file  Occupational History  . Not on file  Social Needs  . Financial resource strain: Not on file  . Food insecurity:    Worry:  Not on file    Inability: Not on file  . Transportation needs:    Medical: Not on file    Non-medical: Not on file  Tobacco Use  . Smoking status: Never Smoker  . Smokeless tobacco: Never Used  Substance and Sexual Activity  . Alcohol use: No  . Drug use: No  . Sexual activity: Not on file  Lifestyle  . Physical activity:    Days per week: Not on file    Minutes per session: Not on file  . Stress: Not on file  Relationships  . Social connections:    Talks on phone: Not on file    Gets together: Not on file    Attends religious service: Not on file    Active member of club or organization: Not on file    Attends meetings of clubs or organizations: Not on file    Relationship status: Not on file  . Intimate partner violence:  Fear of current or ex partner: Not on file    Emotionally abused: Not on file    Physically abused: Not on file    Forced sexual activity: Not on file  Other Topics Concern  . Not on file  Social History Narrative  . Not on file    Past Medical History, Surgical history, Social history, and Family history were reviewed and updated as appropriate.   Please see review of systems for further details on the patient's review from today.   Objective:   Physical Exam:  There were no vitals taken for this visit.  Physical Exam Neurological:     Mental Status: She is alert and oriented to person, place, and time.     Cranial Nerves: No dysarthria.  Psychiatric:        Attention and Perception: Attention normal. She does not perceive auditory hallucinations.        Mood and Affect: Mood is anxious. Mood is not depressed. Affect is not labile, flat, angry or tearful.        Speech: Speech normal.        Behavior: Behavior is cooperative.        Thought Content: Thought content normal. Thought content is not paranoid or delusional. Thought content does not include homicidal or suicidal ideation. Thought content does not include homicidal or suicidal  plan.        Cognition and Memory: Cognition and memory normal.        Judgment: Judgment normal.     Comments: Insight good.  Increase in carbamazepine to 900 mg daily has markedly reduced the intrusive obsessive thoughts about Covid and other obsessive anxious thoughts.  It has helped to reduce the dysphoric mixed mood symptoms.  She has residual anxiety.     Lab Review:  No results found for: NA, K, CL, CO2, GLUCOSE, BUN, CREATININE, CALCIUM, PROT, ALBUMIN, AST, ALT, ALKPHOS, BILITOT, GFRNONAA, GFRAA     Component Value Date/Time   WBC 16.2 (H) 10/29/2007 0505   RBC 2.70 (L) 10/29/2007 0505   HGB 8.7 DELTA CHECK NOTED (L) 10/29/2007 0505   HCT 25.9 (L) 10/29/2007 0505   PLT 183 10/29/2007 0505   MCV 95.8 10/29/2007 0505   MCHC 33.6 10/29/2007 0505   RDW 14.8 10/29/2007 0505    No results found for: POCLITH, LITHIUM   Lab Results  Component Value Date   CBMZ 11.Daniel 11/09/2017     .res Assessment: Plan:    Bipolar II disorder ([place])  Mixed obsessional thoughts and acts  Generalized anxiety disorder  Insomnia due to mental condition   Greater than 50% of face to face time with patient was spent on counseling and coordination of care. We discussed Patient was stable on 900 mg of Equetro for an extended period of time.  However she eventually started developing side effects of dizziness and nausea and vomiting and could no longer tolerate that dosage.  The dosage was cut back to 600 mg and she has subsequently developed a mixture of obsessive-compulsive and irritable hypomanic symptoms.  She is having unusual intrusive obsessive thoughts which were about lice and now is about Covid.  She has been successful at transitioning from [facility] to a mixture of immediate release and extended release carbamazepine.  She is not having nausea and vomiting and she is not having as much daytime sleepiness. Increase in carbamazepine to 900 mg daily has markedly reduced the intrusive obsessive  thoughts about Covid and other obsessive anxious thoughts.  It has helped to reduce the dysphoric mixed mood symptoms.  As she noted the increase from 800 mg 900 mg total carbamazepine was markedly helpful.  She has residual anxiety.  Patient's insurance would not pay for Tegretol XR 100 mg tablets 3 in the morning and 4 at night because of quantity limits.  Therefore the prescription was rewritten Tegretol-XR 100 mg tablets Daniel in the morning #30 and Daniel refill and Tegretol-XR 200 mg tablets Daniel in the morning and 2 at night #90 and Daniel refill.  she is also taking short acting carbamazepine 100 mg tablets 2 at night.  Answered her questions about carbamazepine and long-term health risks.  Discussed risk of rash and aplastic anemia both of which are quite rare.  Continue lamotrigine 200 mg twice daily for bipolar depression  Continue the Wellbutrin SR 150 mg in the morning as she feels that is helpful for energy and appetite control.  And somewhat for attention and focus.  Continue alternating Xanax and Ambien at night but is also taking it prn for anxiety.  I have okayed number 45/month  We discussed the short-term risks associated with benzodiazepines including sedation and increased fall risk among others.  Discussed long-term side effect risk including dependence, potential withdrawal symptoms, and the potential eventual dose-related risk of dementia.  FU 10 weeks.  Lynder Parents, MD, DFAPA   Please see After Visit Summary for patient specific instructions.  Future Appointments  Date Time Provider [place]  6/2/Robin Daniel  2:00 PM May, [place], [facility]-CP None    No orders of the defined types were placed in this encounter.     -------------------------------

## 2018-06-09 ENCOUNTER — Other Ambulatory Visit: Payer: Self-pay | Admitting: Psychiatry

## 2018-06-25 ENCOUNTER — Other Ambulatory Visit: Payer: Self-pay

## 2018-06-25 ENCOUNTER — Ambulatory Visit (INDEPENDENT_AMBULATORY_CARE_PROVIDER_SITE_OTHER): Payer: PRIVATE HEALTH INSURANCE | Admitting: Psychiatry

## 2018-06-25 ENCOUNTER — Encounter: Payer: Self-pay | Admitting: Psychiatry

## 2018-06-25 DIAGNOSIS — F3181 Bipolar II disorder: Secondary | ICD-10-CM | POA: Diagnosis not present

## 2018-06-25 DIAGNOSIS — F422 Mixed obsessional thoughts and acts: Secondary | ICD-10-CM

## 2018-06-25 NOTE — Progress Notes (Signed)
[facility] Counselor/Therapist Progress Note  Patient ID: Robin Daniel, MRN: [id number],    Date: 06/25/2018  Time Spent: 51 minutes   Treatment Type: Individual Therapy  Reported Symptoms: Anxiety, rumination, irritable.  Mental Status Exam:  Appearance:   Well Groomed     Behavior:  Appropriate  Motor:  Normal  Speech/Language:   Clear and Coherent  Affect:  Appropriate  Mood:  anxious and irritable  Thought process:  normal  Thought content:    Rumination  Sensory/Perceptual disturbances:    WNL  Orientation:  oriented to person, place, time/date and situation  Attention:  Good  Concentration:  Good  Memory:  WNL  Fund of knowledge:   Good  Insight:    Good  Judgment:   Good  Impulse Control:  Good   Risk Assessment: Danger to Self:  No Self-injurious Behavior: No Danger to Others: No Duty to Warn:no Physical Aggression / Violence:No  Access to Firearms a concern: No  Gang Involvement:No   Subjective: I met with the client face-to-face today.  We both had facemasks. The client states that she continues to be troubled from an event that happened 7 years ago.  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  She stated she kissed him at one point but things never went farther than that.  The client got stabilized and left the bank.  She currently does not work and takes care of her son at home.  She finds herself obsessing and ruminating over this past event.  "What if my husband is able to see that this is happened?"  She is not worried about others finding out or even it becoming public.  She understands that her worries are not realistic.  We also discussed that her thinking is magical concerning this. We tried using eye-movement to see if we can decrease her level of disturbance connected to this.  She started at a subjective units of distress of 8.  She said the more  she thought about it the worse she felt.  I tried a new tact of overlaying a positive cognition over the old memory.  "It is over its in the past."  As the client integrated this using vertical eye-movement she found that her anxiety decreased and she felt significantly more tired.  She was much calmer at the end of the session.  She does not feel like it is completely dealt with but agrees to work on it at next session.  We also discussed using distraction as a coping mechanism and mood independent behavior.  I encouraged the client to use her audio EMDR file while she is going to sleep as a way to stay focused and control her mind.  The client agrees that she needs to be much more mindful.  Interventions: Mindfulness Meditation, Motivational Interviewing, Solution-Oriented/Positive Psychology, [employer] [facility] Desensitization and Reprocessing (EMDR) and Insight-Oriented  Diagnosis:   ICD-10-CM   1. Bipolar II disorder ([place]) F31.81   2. Mixed obsessional thoughts and acts F42.2     Plan: Mindfulness, mood independent behavior, EMDR audio file, distraction, self-care.  This record has been created using [employer].  Chart creation errors have been sought, but Kayce Chismar not always have been located and corrected. Such creation errors do not reflect on the standard of medical care.  Teriah Muela, [facility]

## 2018-07-03 ENCOUNTER — Telehealth: Payer: Self-pay | Admitting: Psychiatry

## 2018-07-03 ENCOUNTER — Other Ambulatory Visit: Payer: Self-pay | Admitting: Psychiatry

## 2018-07-03 NOTE — Telephone Encounter (Signed)
Given information and verbalized understanding.

## 2018-07-03 NOTE — Telephone Encounter (Signed)
Tell her to not take any of the short acting carbamazepine tonight.  Just take the XR carbamazepine tonight and then resume normal dosing tomorrow.  This medication clears the body very quickly which is why it has to be taken twice a day no she will be  feeling fine tomorrow

## 2018-07-03 NOTE — Telephone Encounter (Signed)
Ptatient suppose to take 9 mg of Equatro she took 1400 mg by mistake on yesterday, started feeling sick wants to know what she should do, took normal dosage today, and took 500 mg extra between ER and regular on yesterday, wants to make sure she didn't overdose

## 2018-07-11 ENCOUNTER — Telehealth: Payer: Self-pay | Admitting: Psychiatry

## 2018-07-11 NOTE — Telephone Encounter (Signed)
Put her on the schedule for tomorrow afternoon as a work in on Friday, June 19

## 2018-07-11 NOTE — Telephone Encounter (Signed)
Patient called and said that last week two days in a row she took the medicine wrong.This week after she takes the medicine she is feeling dizzy.It is not too bad this is what happened last time when she took the medicine. She is worried that he medicine has build up in her system and she can no longer tolerate it . She even changed to generic doesn't know what to do? Please call her at [phone]

## 2018-07-12 ENCOUNTER — Ambulatory Visit (INDEPENDENT_AMBULATORY_CARE_PROVIDER_SITE_OTHER): Payer: PRIVATE HEALTH INSURANCE | Admitting: Psychiatry

## 2018-07-12 ENCOUNTER — Encounter: Payer: Self-pay | Admitting: Psychiatry

## 2018-07-12 ENCOUNTER — Other Ambulatory Visit: Payer: Self-pay

## 2018-07-12 DIAGNOSIS — F3181 Bipolar II disorder: Secondary | ICD-10-CM

## 2018-07-12 DIAGNOSIS — F5105 Insomnia due to other mental disorder: Secondary | ICD-10-CM | POA: Diagnosis not present

## 2018-07-12 DIAGNOSIS — F422 Mixed obsessional thoughts and acts: Secondary | ICD-10-CM

## 2018-07-12 DIAGNOSIS — T887XXA Unspecified adverse effect of drug or medicament, initial encounter: Secondary | ICD-10-CM

## 2018-07-12 DIAGNOSIS — F411 Generalized anxiety disorder: Secondary | ICD-10-CM

## 2018-07-12 NOTE — Progress Notes (Signed)
Robin Daniel [id number] 23-Apr-1975 43 y.o.    Subjective:   Patient ID:  Robin Daniel is a 43 y.o. (DOB 1975-09-05) female.  Chief Complaint:  Chief Complaint  Patient presents with  . Medication Problem    side effects  . mood problems    med management    Depression        Associated symptoms include no decreased concentration and no suicidal ideas.  Past medical history includes anxiety.   Anxiety Patient reports no confusion, decreased concentration, nervous/anxious behavior or suicidal ideas.     Robin Daniel call yesterday for an urgent visit because of problems keeping her meds straight with resultant side effect issues.  Follow-up med changes and mood.  Last visit Jun 06, 2018.  Insurance problems not covering Equetro resulted in the patient having to switch to a mixture of Tegretol-XR 100 mg tablets, 200 mg Tegretol-XR tablets, and immediate release to 200 mg carbamazepine at night this is resulted in some patient confusion about how to take her medicines and she has called a couple of times since her last appointment having mixed her medications up and having problems.  She is taking an unusual mix between long-acting and short acting carbamazepine in order to achieve maximum mood benefit but avoid side effects of nausea and dizziness without this combination and also dictated by insurance limitations.  Patient's insurance would not pay for Tegretol XR 100 mg tablets 3 in the morning and 4 at night because of quantity limits.  Therefore the prescription was rewritten Tegretol-XR 100 mg tablets 1 in the morning #30 and 1 refill and Tegretol-XR 200 mg tablets 1 in the morning and 2 at night #90 and 1 refill.  short acting carbamazepine 100 mg tablets 2 at night.  This was explained to the patient in detail a couple of times.  It was also given to her in writing.  However she got it confused and started taking CBZ IR 100 in the AM instead of the XR 154m in the AM.   Dizziness here and there after meds inconsistently.  Over the last few days lunch and nighttime dizzy more.  Would have to sit and shut eyes.  She feels like it was similar to when she started not tolerating  Equetro.  On CBZ 900 mg for 8 weeks.  Weight is unchanged.  She also has had a good response to the full dose of Equetro.  .Patient reports stable mood and denies depressed or irritable moods.  Patient denies difficulty with sleep initiation or maintenance. Still struggles with chronic anxiety and negative thoughts about herself.  Denies appetite disturbance.  Patient reports that energy and motivation have been good.  Patient denies any difficulty with concentration.  Patient denies any suicidal ideation. Also is irritalble and anxious with period.    Alternating ambien and Xanax for sleep to help reduce dependence risks.  Usually sleeps better with Xanax.   Trouble sleeping and taking 14mXanax nightly and that gives 8 hours.  Works better than Ambien.  Fear of insomnia.    Past Psychiatric Medication Trials: Carbamazepine 900, lamotrigine XR 200 mg twice daily, Wellbutrin SR 100 mg twice daily, naltrexone, Abilify, sertraline, stimulants caused mania, Trintellix, alprazolam, Ambien, N-acetylcysteine, topiramate, paroxetine weakness, other antidepressants  Review of Systems: No tremor no weakness no nausea vomiting.  Positive dizziness.  Medications: I have reviewed the patient's current medications.  Current Outpatient Medications  Medication Sig Dispense Refill  . ALPRAZolam (XANAX) 1 MG  tablet 1 tablet at night and one half daily as needed for anxiety 45 tablet 2  . bimatoprost (LATISSE) 0.03 % ophthalmic solution PLACE 1 DROP ON APPLICATOR & APPLY EVENLY AT THE BASE OF EYELASHES NIGHTLY ON BOTH EYES.    Marland Kitchen buPROPion (WELLBUTRIN SR) 150 MG 12 hr tablet Take 1 tablet (150 mg total) by mouth every morning. 90 tablet 1  . carbamazepine (TEGRETOL XR) 200 MG 12 hr tablet TAKE 1 TABLET BY  MOUTH IN THE MORNONG AND 2 AT NIGHT 90 tablet 2  . lamoTRIgine (LAMICTAL) 200 MG tablet Take 1 tablet (200 mg total) by mouth 2 (two) times daily. 180 tablet 1  . levothyroxine (SYNTHROID, LEVOTHROID) 50 MCG tablet Take 1 tablet by mouth every morning.  5  . zolpidem (AMBIEN) 10 MG tablet Take 0.5-1 tablets (5-10 mg total) by mouth at bedtime as needed. 30 tablet 2  . carbamazepine (TEGRETOL) 100 MG chewable tablet Chew 2 tablets (200 mg total) by mouth at bedtime. (Patient taking differently: Chew 200 mg by mouth at bedtime. 1 in the AM and 2PM by mistake) 180 tablet 0  . carbamazepine (TEGRETOL-XR) 100 MG 12 hr tablet Take 1 tablet (100 mg total) by mouth every morning. (Patient not taking: Reported on 07/12/2018) 30 tablet 1   No current facility-administered medications for this visit.     Medication Side Effects: None no longer dizziness, nausea  Allergies: No Known Allergies  Past Medical History:  Diagnosis Date  . Bipolar disorder ([place])     Family History  Problem Relation Age of Onset  . Breast cancer Neg Hx     Social History   Socioeconomic History  . Marital status: Married    Spouse name: Not on file  . Number of children: Not on file  . Years of education: Not on file  . Highest education level: Not on file  Occupational History  . Not on file  Social Needs  . Financial resource strain: Not on file  . Food insecurity    Worry: Not on file    Inability: Not on file  . Transportation needs    Medical: Not on file    Non-medical: Not on file  Tobacco Use  . Smoking status: Never Smoker  . Smokeless tobacco: Never Used  Substance and Sexual Activity  . Alcohol use: No  . Drug use: No  . Sexual activity: Not on file  Lifestyle  . Physical activity    Days per week: Not on file    Minutes per session: Not on file  . Stress: Not on file  Relationships  . Social Herbalist on phone: Not on file    Gets together: Not on file    Attends religious  service: Not on file    Active member of club or organization: Not on file    Attends meetings of clubs or organizations: Not on file    Relationship status: Not on file  . Intimate partner violence    Fear of current or ex partner: Not on file    Emotionally abused: Not on file    Physically abused: Not on file    Forced sexual activity: Not on file  Other Topics Concern  . Not on file  Social History Narrative  . Not on file    Past Medical History, Surgical history, Social history, and Family history were reviewed and updated as appropriate.   Please see review of systems for further details on  the patient's review from today.   Objective:   Physical Exam:  There were no vitals taken for this visit.  Physical Exam Constitutional:      General: She is not in acute distress.    Appearance: She is well-developed.  Musculoskeletal:        General: No deformity.  Neurological:     Mental Status: She is alert and oriented to person, place, and time.     Cranial Nerves: No dysarthria.     Coordination: Coordination normal.  Psychiatric:        Attention and Perception: Attention normal. She does not perceive auditory hallucinations.        Mood and Affect: Mood is anxious. Mood is not depressed. Affect is not labile, blunt, flat, angry, tearful or inappropriate.        Speech: Speech normal.        Behavior: Behavior normal. Behavior is cooperative.        Thought Content: Thought content normal. Thought content is not paranoid or delusional. Thought content does not include homicidal or suicidal ideation. Thought content does not include homicidal or suicidal plan.        Cognition and Memory: Cognition and memory normal.        Judgment: Judgment normal.     Comments: Insight good.   She has residual anxiety.     Lab Review:  No results found for: NA, K, CL, CO2, GLUCOSE, BUN, CREATININE, CALCIUM, PROT, ALBUMIN, AST, ALT, ALKPHOS, BILITOT, GFRNONAA, GFRAA     Component  Value Date/Time   WBC 16.2 (H) 10/29/2007 0505   RBC 2.70 (L) 10/29/2007 0505   HGB 8.7 DELTA CHECK NOTED (L) 10/29/2007 0505   HCT 25.9 (L) 10/29/2007 0505   PLT 183 10/29/2007 0505   MCV 95.8 10/29/2007 0505   MCHC 33.6 10/29/2007 0505   RDW 14.8 10/29/2007 0505    No results found for: POCLITH, LITHIUM   Lab Results  Component Value Date   CBMZ 11.1 11/09/2017     .res Assessment: Plan:    Jazel was seen today for medication problem and mood problems.  Diagnoses and all orders for this visit:  Bipolar II disorder ([place])  Mixed obsessional thoughts and acts  Generalized anxiety disorder  Insomnia due to mental condition  Medication side effects   Greater than 50% of face to face time with patient was spent on counseling and coordination of care. We discussed Patient was stable on 900 mg of Equetro for an extended period of time.  However she eventually started developing side effects of dizziness and nausea and vomiting and could no longer tolerate that dosage.  The dosage was cut back to 600 mg and she has subsequently developed a mixture of obsessive-compulsive and irritable hypomanic symptoms.  She was having unusual intrusive obsessive thoughts which were about lice and then about Covid.  She had been successful at transitioning from [facility] to a mixture of immediate release and extended release carbamazepine.  She was tolerating the 900 mg daily had markedly reduced the intrusive obsessive thoughts about Covid and other obsessive anxious thoughts.  It had helped to reduce the dysphoric mixed mood symptoms.  As she noted the increase from 800 mg 900 mg total carbamazepine was markedly helpful.  However int he last 2 weeks or so she got the meds mixed up and started getting dizzy again.  She has residual anxiety.  Patient's insurance would not pay for Tegretol XR 100 mg tablets 3 in  the morning and 4 at night because of quantity limits.  Therefore the prescription was  rewritten Tegretol-XR 100 mg tablets 1 in the morning #30 and 1 refill and Tegretol-XR 200 mg tablets 1 in the morning and 2 at night #90 and 1 refill.  she is also to take short acting carbamazepine 100 mg tablets 2 at night.  Continue lamotrigine 200 mg twice daily for bipolar depression  Continue the Wellbutrin SR 150 mg in the morning as she feels that is helpful for energy and appetite control.  And somewhat for attention and focus.  Continue alternating Xanax and Ambien at night but is also taking it prn for anxiety.  I have okayed number 45/month  We discussed the short-term risks associated with benzodiazepines including sedation and increased fall risk among others.  Discussed long-term side effect risk including dependence, potential withdrawal symptoms, and the potential eventual dose-related risk of dementia.  This appt was 30 mins.  FU as scheduled  Lynder Parents, MD, DFAPA   Please see After Visit Summary for patient specific instructions.  In the morning: Carbamazepine ER 100 mg 1 tablet Carbamazepine ER 200 mg 1 tablet  At bedtime:   Carbamazepine ER 200 mg 2 tablets at night Carbamazepine 100 mg 2 tablet  Future Appointments  Date Time Provider [place]  07/23/2018  3:00 PM May, [place], [facility] CP-CP None  08/20/2018 10:30 AM Cottle, Billey Co., MD CP-CP None    No orders of the defined types were placed in this encounter.     -------------------------------

## 2018-07-12 NOTE — Telephone Encounter (Signed)
Patient coming in at 2:00 pm today

## 2018-07-12 NOTE — Patient Instructions (Signed)
In the morning: Carbamazepine ER 100 mg 1 tablet Carbamazepine ER 200 mg 1 tablet  At bedtime:    Carbamazepine ER 200 mg 2 tablets at night Carbamazepine 100 mg 2 tablet

## 2018-07-23 ENCOUNTER — Encounter: Payer: Self-pay | Admitting: Psychiatry

## 2018-07-23 ENCOUNTER — Other Ambulatory Visit: Payer: Self-pay

## 2018-07-23 ENCOUNTER — Ambulatory Visit (INDEPENDENT_AMBULATORY_CARE_PROVIDER_SITE_OTHER): Payer: PRIVATE HEALTH INSURANCE | Admitting: Psychiatry

## 2018-07-23 DIAGNOSIS — F3181 Bipolar II disorder: Secondary | ICD-10-CM

## 2018-07-23 NOTE — Progress Notes (Signed)
      [facility] Counselor/Therapist Progress Note  Patient ID: Robin Daniel, MRN: [id number],    Date: 07/23/2018  Time Spent: 50 minutes   Treatment Type: Individual Therapy  Reported Symptoms: anxiety, sadness  Mental Status Exam:  Appearance:   Well Groomed     Behavior:  Appropriate  Motor:  Normal  Speech/Language:   Clear and Coherent  Affect:  Appropriate  Mood:  anxious and sad  Thought process:  normal  Thought content:    WNL  Sensory/Perceptual disturbances:    WNL  Orientation:  oriented to person, place, time/date and situation  Attention:  Good  Concentration:  Good  Memory:  WNL  Fund of knowledge:   Good  Insight:    Good  Judgment:   Good  Impulse Control:  Good   Risk Assessment: Danger to Self:  No Self-injurious Behavior: No Danger to Others: No Duty to Warn:no Physical Aggression / Violence:No  Access to Firearms a concern: No  Gang Involvement:No   Subjective: I met with the client face-to-face.  We both had facemasks. The client states that, "I am out of sorts.  I need normalcy in my life".  The client describes high anxiety at all the civil unrest in the ongoing [place] pandemic.  "We will life ever be the same?" Today I used eye-movement with the client to help reduce her anxiety related to the circumstances that are out of her control.  We discussed how her anxiety is driven by the uncertainty and sense of powerlessness that she feels.  Even related to her parents she notes, "they are getting old."  This makes the client reflect on her own age in combination with what is happening in the world she begins to feel more powerless.  We discussed trying to be more mindful and stay in the present tense.  I gave the client a handout on mindfulness techniques to help her stay focused and control her thoughts.  She agreed to try these as a practice.  At the end of the session the clients subjective units of distress went from an 8 to less than 2.  Her  positive cognition was, "I can manage things."  Interventions: Assertiveness/Communication, Mindfulness Meditation, Motivational Interviewing, Solution-Oriented/Positive Psychology, [employer] Group Desensitization and Reprocessing (EMDR) and Insight-Oriented  Diagnosis:   ICD-10-CM   1. Bipolar II disorder ([place])  F31.81     Plan: Mindfulness, positive self talk, exercise, self-care, good sleep hygiene.  This record has been created using [employer].  Chart creation errors have been sought, but Lanie Schelling not always have been located and corrected. Such creation errors do not reflect on the standard of medical care.  Lalanya Rufener, [facility]

## 2018-07-25 ENCOUNTER — Other Ambulatory Visit: Payer: Self-pay | Admitting: Psychiatry

## 2018-07-25 NOTE — Telephone Encounter (Signed)
Is this correct?

## 2018-08-01 ENCOUNTER — Other Ambulatory Visit: Payer: Self-pay | Admitting: Obstetrics and Gynecology

## 2018-08-01 DIAGNOSIS — Z1231 Encounter for screening mammogram for malignant neoplasm of breast: Secondary | ICD-10-CM

## 2018-08-20 ENCOUNTER — Ambulatory Visit: Payer: PRIVATE HEALTH INSURANCE | Admitting: Psychiatry

## 2018-08-29 ENCOUNTER — Other Ambulatory Visit: Payer: Self-pay | Admitting: Psychiatry

## 2018-08-30 NOTE — Telephone Encounter (Signed)
The pharmacy said she's requesting this?

## 2018-09-02 ENCOUNTER — Other Ambulatory Visit: Payer: Self-pay | Admitting: Psychiatry

## 2018-09-04 ENCOUNTER — Other Ambulatory Visit: Payer: Self-pay | Admitting: Psychiatry

## 2018-09-12 ENCOUNTER — Other Ambulatory Visit: Payer: Self-pay | Admitting: Internal Medicine

## 2018-09-12 DIAGNOSIS — R3121 Asymptomatic microscopic hematuria: Secondary | ICD-10-CM

## 2018-09-16 ENCOUNTER — Telehealth: Payer: Self-pay | Admitting: Psychiatry

## 2018-09-16 ENCOUNTER — Ambulatory Visit: Payer: BLUE CROSS/BLUE SHIELD

## 2018-09-16 NOTE — Telephone Encounter (Signed)
It should not be stopped abruptly.  Reduce the lamotrigine to 100 mg daily.  That should stop the dizziness.

## 2018-09-16 NOTE — Telephone Encounter (Signed)
RTC to [place], and she is aware to take lamictal 100 mg at lunch, advised the dizziness should improve. Instructed to call back with anymore problems or concerns.

## 2018-09-16 NOTE — Telephone Encounter (Signed)
Patient stated Lamictal is making her feel dizzy at lunch time again, patient says she's going to stop taking the 200 mg. At lunch until her appt., in October, she is currently on the wait list

## 2018-09-19 ENCOUNTER — Ambulatory Visit: Payer: Self-pay | Admitting: Obstetrics and Gynecology

## 2018-09-20 ENCOUNTER — Encounter

## 2018-09-23 ENCOUNTER — Encounter: Payer: Self-pay | Admitting: Psychiatry

## 2018-09-23 ENCOUNTER — Ambulatory Visit: Payer: PRIVATE HEALTH INSURANCE | Admitting: Psychiatry

## 2018-09-23 ENCOUNTER — Encounter

## 2018-09-23 ENCOUNTER — Other Ambulatory Visit: Payer: Self-pay

## 2018-09-23 DIAGNOSIS — F422 Mixed obsessional thoughts and acts: Secondary | ICD-10-CM | POA: Diagnosis not present

## 2018-09-23 DIAGNOSIS — F3181 Bipolar II disorder: Secondary | ICD-10-CM

## 2018-09-23 NOTE — Progress Notes (Signed)
      [facility] Counselor/Therapist Progress Note  Patient ID: Robin Daniel, MRN: [id number],    Date: 09/23/2018  Time Spent: 55 minutes   Treatment Type: Individual Therapy  Reported Symptoms: anxious, rumination  Mental Status Exam:  Appearance:   Well Groomed     Behavior:  Appropriate  Motor:  Normal  Speech/Language:   Clear and Coherent  Affect:  Appropriate  Mood:  anxious  Thought process:  normal  Thought content:    Obsessions and Rumination  Sensory/Perceptual disturbances:    WNL  Orientation:  oriented to person, place, time/date and situation  Attention:  Good  Concentration:  Good  Memory:  WNL  Fund of knowledge:   Good  Insight:    Good  Judgment:   Good  Impulse Control:  Good   Risk Assessment: Danger to Self:  No Self-injurious Behavior: No Danger to Others: No Duty to Warn:no Physical Aggression / Violence:No  Access to Firearms a concern: No  Gang Involvement:No   Subjective: The client states that things have been getting harder for her recently.  "I am not sleeping as well".  The client states she is waking up in the middle of the night scared about the death of her son, her husband or her sister.  Currently she is less worried about her son since he is home with her doing schoolwork online.  She is concerned that his education is subpar but hopes that by the spring semester things will be better. 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  This was during her manic episode where she hugged and kissed him.  She is now fearful that her husband Robin Daniel somehow find out and it will ruin their marriage.  "No one really knows my secrets."  She has thoughts of accusation, feelings of shamefullness, embarrassment and stupidity.  The client is overwhelmed with these feelings.  Today I used the bilateral stimulation hand paddles with the client.  She visualized  herself on the beach with Jesus and a giant chest that was open.  She poured into that all of the accusations and shame, embarrassment and stupidity she feels.  Jesus then closed it and threw it into the ocean.  The client had a tremendous relief at this.  Her subjective units of distress went from an 8+ to less than 2.  Her positive cognition was, "I can be okay."  Interventions: Mindfulness Meditation, Motivational Interviewing, Solution-Oriented/Positive Psychology, [employer] Desensitization and Reprocessing (EMDR) and Insight-Oriented  Diagnosis:   ICD-10-CM   1. Mixed obsessional thoughts and acts  F42.2   2. Bipolar II disorder ([place])  F31.81     Plan: Positive self talk, mindfulness, exercise, assertiveness, self-care.  Robin Daniel, [facility]

## 2018-09-24 ENCOUNTER — Other Ambulatory Visit: Payer: Self-pay

## 2018-09-24 ENCOUNTER — Ambulatory Visit
Admission: RE | Admit: 2018-09-24 | Discharge: 2018-09-24 | Disposition: A | Discharge status: Home / Self Care | Payer: PRIVATE HEALTH INSURANCE | Source: Ambulatory Visit | Attending: Internal Medicine | Admitting: Internal Medicine

## 2018-09-24 DIAGNOSIS — R3121 Asymptomatic microscopic hematuria: Secondary | ICD-10-CM

## 2018-09-30 ENCOUNTER — Other Ambulatory Visit: Payer: Self-pay | Admitting: Psychiatry

## 2018-10-11 ENCOUNTER — Other Ambulatory Visit: Payer: Self-pay

## 2018-10-11 ENCOUNTER — Ambulatory Visit
Admission: RE | Admit: 2018-10-11 | Discharge: 2018-10-11 | Disposition: A | Discharge status: Home / Self Care | Payer: BLUE CROSS/BLUE SHIELD | Source: Ambulatory Visit | Attending: Obstetrics and Gynecology | Admitting: Obstetrics and Gynecology

## 2018-10-11 DIAGNOSIS — Z1231 Encounter for screening mammogram for malignant neoplasm of breast: Secondary | ICD-10-CM

## 2018-10-14 ENCOUNTER — Other Ambulatory Visit: Payer: Self-pay | Admitting: Obstetrics and Gynecology

## 2018-10-14 DIAGNOSIS — R928 Other abnormal and inconclusive findings on diagnostic imaging of breast: Secondary | ICD-10-CM

## 2018-10-15 ENCOUNTER — Ambulatory Visit
Admission: RE | Admit: 2018-10-15 | Discharge: 2018-10-15 | Disposition: A | Discharge status: Home / Self Care | Payer: PRIVATE HEALTH INSURANCE | Source: Ambulatory Visit | Attending: Obstetrics and Gynecology | Admitting: Obstetrics and Gynecology

## 2018-10-15 ENCOUNTER — Other Ambulatory Visit: Payer: Self-pay | Admitting: Obstetrics and Gynecology

## 2018-10-15 ENCOUNTER — Other Ambulatory Visit: Payer: Self-pay

## 2018-10-15 DIAGNOSIS — R928 Other abnormal and inconclusive findings on diagnostic imaging of breast: Secondary | ICD-10-CM

## 2018-10-15 DIAGNOSIS — N6489 Other specified disorders of breast: Secondary | ICD-10-CM

## 2018-10-18 ENCOUNTER — Ambulatory Visit: Payer: PRIVATE HEALTH INSURANCE | Admitting: Psychiatry

## 2018-10-30 ENCOUNTER — Other Ambulatory Visit: Payer: Self-pay | Admitting: Psychiatry

## 2018-10-31 ENCOUNTER — Ambulatory Visit: Payer: PRIVATE HEALTH INSURANCE | Admitting: Psychiatry

## 2018-11-19 ENCOUNTER — Ambulatory Visit (INDEPENDENT_AMBULATORY_CARE_PROVIDER_SITE_OTHER): Payer: PRIVATE HEALTH INSURANCE | Admitting: Psychiatry

## 2018-11-19 ENCOUNTER — Other Ambulatory Visit: Payer: Self-pay

## 2018-11-19 ENCOUNTER — Encounter: Payer: Self-pay | Admitting: Psychiatry

## 2018-11-19 DIAGNOSIS — F3181 Bipolar II disorder: Secondary | ICD-10-CM

## 2018-11-19 DIAGNOSIS — F422 Mixed obsessional thoughts and acts: Secondary | ICD-10-CM | POA: Diagnosis not present

## 2018-11-19 DIAGNOSIS — F5105 Insomnia due to other mental disorder: Secondary | ICD-10-CM | POA: Diagnosis not present

## 2018-11-19 DIAGNOSIS — F411 Generalized anxiety disorder: Secondary | ICD-10-CM

## 2018-11-19 NOTE — Progress Notes (Signed)
Robin Daniel [id number] [birth year] 43 y.o.    Subjective:   Patient ID:  Robin Daniel is a 43 y.o. (DOB 10-12-1975) female.  Chief Complaint:  Chief Complaint  Patient presents with  . Follow-up    Medication Management  . Other    Bipolar 2    Depression        Associated symptoms include no decreased concentration and no suicidal ideas.  Past medical history includes anxiety.   Anxiety Patient reports no confusion, decreased concentration, nervous/anxious behavior or suicidal ideas.     Robin Daniel Lyn call yesterday for an urgent visit because of problems keeping her meds straight with resultant side effect issues.  Follow-up med changes and mood.  At visit Jun 06, 2018.  Insurance problems not covering Equetro resulted in the patient having to switch to a mixture of Tegretol-XR 100 mg tablets, 200 mg Tegretol-XR tablets, and immediate release to 200 mg carbamazepine at night this is resulted in some patient confusion about how to take her medicines and she has called a couple of times since her last appointment having mixed her medications up and having problems.  Overall doing well.  No sig mood swings except PMDD.  Anxiety is manageable.  No major mood swings.  Trouble with getting enough fiber.  Taken Miralax for years.    Reduced lamotrigine in August DT dizziness and the dizziness resolved.  .Patient reports stable mood and denies depressed or irritable moods.  Patient denies difficulty with sleep initiation or maintenance. Still struggles with chronic anxiety and negative thoughts about herself.  Denies appetite disturbance.  Patient reports that energy and motivation have been good.  Patient denies any difficulty with concentration.  Patient denies any suicidal ideation. Also is irritalble and anxious with period.    Alternating ambien and Xanax for sleep to help reduce dependence risks.  Usually sleeps better with Xanax.   Uses melatonin  regularly and working OK right now and rotating with Xanax and Ambien.  Trouble sleeping and taking 75m Xanax nightly and that gives 8 hours.  Works better than Ambien.  Fear of insomnia.    Past Psychiatric Medication Trials: Carbamazepine 900, lamotrigine XR 200 mg twice daily, Wellbutrin SR 100 mg twice daily, naltrexone, Abilify, sertraline, stimulants caused mania, Trintellix, alprazolam, Ambien, N-acetylcysteine, topiramate, paroxetine weakness, other antidepressants She also has had a good response to the full dose of Equetro.  Review of Systems: No tremor no weakness no nausea vomiting. No dizziness.  Medications: I have reviewed the patient's current medications.  Current Outpatient Medications  Medication Sig Dispense Refill  . ALPRAZolam (XANAX) 1 MG tablet 1 tablet at night and one half daily as needed for anxiety 45 tablet 2  . bimatoprost (LATISSE) 0.03 % ophthalmic solution PLACE 1 DROP ON APPLICATOR & APPLY EVENLY AT THE BASE OF EYELASHES NIGHTLY ON BOTH EYES.    .Marland KitchenbuPROPion (WELLBUTRIN SR) 150 MG 12 hr tablet Take 1 tablet (150 mg total) by mouth every morning. 90 tablet 1  . carbamazepine (TEGRETOL XR) 100 MG 12 hr tablet TAKE 1 TABLET BY MOUTH EVERY DAY IN THE MORNING 30 tablet 1  . carbamazepine (TEGRETOL XR) 200 MG 12 hr tablet TAKE 1 TABLET BY MOUTH IN THE MORNING AND 2 AT NIGHT 270 tablet 0  . carbamazepine (TEGRETOL) 100 MG chewable tablet Chew 2 tablets (200 mg total) by mouth at bedtime. 180 tablet 0  . lamoTRIgine (LAMICTAL) 200 MG tablet TAKE 1 TABLET BY MOUTH TWICE A DAY (  Patient taking differently: 1/2 at lunch and 1 at nigh) 180 tablet 1  . levothyroxine (SYNTHROID, LEVOTHROID) 50 MCG tablet Take 1 tablet by mouth every morning.  5  . zolpidem (AMBIEN) 10 MG tablet Take 0.5-1 tablets (5-10 mg total) by mouth at bedtime as needed. 30 tablet 2   No current facility-administered medications for this visit.     Medication Side Effects: None no longer dizziness,  nausea  Allergies: No Known Allergies  Past Medical History:  Diagnosis Date  . Bipolar disorder ([place])     Family History  Problem Relation Age of Onset  . Breast cancer Neg Hx     Social History   Socioeconomic History  . Marital status: Married    Spouse name: Not on file  . Number of children: Not on file  . Years of education: Not on file  . Highest education level: Not on file  Occupational History  . Not on file  Social Needs  . Financial resource strain: Not on file  . Food insecurity    Worry: Not on file    Inability: Not on file  . Transportation needs    Medical: Not on file    Non-medical: Not on file  Tobacco Use  . Smoking status: Never Smoker  . Smokeless tobacco: Never Used  Substance and Sexual Activity  . Alcohol use: No  . Drug use: No  . Sexual activity: Not on file  Lifestyle  . Physical activity    Days per week: Not on file    Minutes per session: Not on file  . Stress: Not on file  Relationships  . Social Herbalist on phone: Not on file    Gets together: Not on file    Attends religious service: Not on file    Active member of club or organization: Not on file    Attends meetings of clubs or organizations: Not on file    Relationship status: Not on file  . Intimate partner violence    Fear of current or ex partner: Not on file    Emotionally abused: Not on file    Physically abused: Not on file    Forced sexual activity: Not on file  Other Topics Concern  . Not on file  Social History Narrative  . Not on file    Past Medical History, Surgical history, Social history, and Family history were reviewed and updated as appropriate.   Please see review of systems for further details on the patient's review from today.   Objective:   Physical Exam:  There were no vitals taken for this visit.  Physical Exam Constitutional:      General: She is not in acute distress.    Appearance: She is well-developed.   Musculoskeletal:        General: No deformity.  Neurological:     Mental Status: She is alert and oriented to person, place, and time.     Cranial Nerves: No dysarthria.     Coordination: Coordination normal.  Psychiatric:        Attention and Perception: Attention normal. She does not perceive auditory hallucinations.        Mood and Affect: Mood is anxious. Mood is not depressed. Affect is not labile, blunt, flat, angry, tearful or inappropriate.        Speech: Speech normal.        Behavior: Behavior normal. Behavior is cooperative.        Thought  Content: Thought content normal. Thought content is not paranoid or delusional. Thought content does not include homicidal or suicidal ideation. Thought content does not include homicidal or suicidal plan.        Cognition and Memory: Cognition and memory normal.        Judgment: Judgment normal.     Comments: Insight good.   She has residual anxiety.     Lab Review:  No results found for: NA, K, CL, CO2, GLUCOSE, BUN, CREATININE, CALCIUM, PROT, ALBUMIN, AST, ALT, ALKPHOS, BILITOT, GFRNONAA, GFRAA     Component Value Date/Time   WBC 16.2 (H) 10/29/2007 0505   RBC 2.70 (L) 10/29/2007 0505   HGB 8.7 DELTA CHECK NOTED (L) 10/29/2007 0505   HCT 25.9 (L) 10/29/2007 0505   PLT 183 10/29/2007 0505   MCV 95.8 10/29/2007 0505   MCHC 33.6 10/29/2007 0505   RDW 14.8 10/29/2007 0505    No results found for: POCLITH, LITHIUM   Lab Results  Component Value Date   CBMZ 11.1 11/09/2017     .res Assessment: Plan:    Davie was seen today for follow-up and other.  Diagnoses and all orders for this visit:  Bipolar II disorder ([employer])  Mixed obsessional thoughts and acts  Generalized anxiety disorder  Insomnia due to mental condition     Greater than 50% of face to face time with patient was spent on counseling and coordination of care. We discussed Patient was stable on 900 mg of Equetro for an extended period of time.  However she  eventually started developing side effects of dizziness and nausea and vomiting and could no longer tolerate that dosage.  The dosage was cut back to 600 mg and she has subsequently developed a mixture of obsessive-compulsive and irritable hypomanic symptoms.  She was having unusual intrusive obsessive thoughts which were about lice and then about Covid.  She had been successful at transitioning from [facility] to a mixture of immediate release and extended release carbamazepine.  She was tolerating the 900 mg daily had markedly reduced the intrusive obsessive thoughts about Covid and other obsessive anxious thoughts.  It had helped to reduce the dysphoric mixed mood symptoms.  As she noted the increase from 800 mg 900 mg total carbamazepine was markedly helpful.  She has residual anxiety. But does not want more meds.  Patient's insurance would not pay for Tegretol XR 100 mg tablets 3 in the morning and 4 at night because of quantity limits.  Therefore the prescription was rewritten Tegretol-XR 100 mg tablets 1 in the morning #30 and 1 refill and Tegretol-XR 200 mg tablets 1 in the morning and 2 at night #90 and 1 refill.  she is also to take short acting carbamazepine 100 mg tablets 2 at night.  Continue lamotrigine 100 mg at noon and 200 mg at night for bipolar depression  Continue the Wellbutrin SR 150 mg in the morning as she feels that is helpful for energy and appetite control.  And somewhat for attention and focus.  Continue alternating Xanax and Ambien at night but is also taking it prn for anxiety.  I have okayed number 45/month  She is taking an unusual mix between long-acting and short acting carbamazepine in order to achieve maximum mood benefit but avoid side effects of nausea and dizziness without this combination and also dictated by insurance limitations.  Patient's insurance would not pay for Tegretol XR 100 mg tablets 3 in the morning and 4 at night because of quantity  limits.   Therefore the prescription was rewritten Tegretol-XR 100 mg tablets 1 in the morning #30 and 1 refill and Tegretol-XR 200 mg tablets 1 in the morning and 2 at night #90 and 1 refill.  short acting carbamazepine 100 mg tablets 2 at night.  This was explained to the patient in detail a couple of times.  It was also given to her in writing.  We discussed the short-term risks associated with benzodiazepines including sedation and increased fall risk among others.  Discussed long-term side effect risk including dependence, potential withdrawal symptoms, and the potential eventual dose-related risk of dementia.  Worry over cholesterol and answered her questions re: diet and fiber and cholesterol.   This appt was 30 mins.  FU 6 mos  Lynder Parents, MD, DFAPA   Please see After Visit Summary for patient specific instructions.   Future Appointments  Date Time Provider [place]  12/12/2018  2:00 PM May, [place], [facility] CP-CP None  04/15/2019  8:30 AM [facility] DI[facility] TOMO 2 [facility] CE  05/20/2019  9:45 AM Cottle, [place]., MD CP-CP None    No orders of the defined types were placed in this encounter.     -------------------------------

## 2018-12-03 ENCOUNTER — Other Ambulatory Visit: Payer: Self-pay | Admitting: Psychiatry

## 2018-12-06 ENCOUNTER — Other Ambulatory Visit: Payer: Self-pay | Admitting: Psychiatry

## 2018-12-12 ENCOUNTER — Ambulatory Visit: Payer: PRIVATE HEALTH INSURANCE | Admitting: Psychiatry

## 2018-12-13 ENCOUNTER — Other Ambulatory Visit: Payer: Self-pay | Admitting: Psychiatry

## 2018-12-18 ENCOUNTER — Other Ambulatory Visit: Payer: Self-pay | Admitting: Psychiatry

## 2018-12-18 NOTE — Telephone Encounter (Signed)
Patient called and saids tha pharmacy has not received xanax or ambien please resend. Also she wants to try red yeast rice supplement she wants to take make sure it is ok to take this with all of her medicnes. Please give her a call.

## 2018-12-18 NOTE — Telephone Encounter (Signed)
Any refills? Next apt 04/24

## 2019-01-09 ENCOUNTER — Other Ambulatory Visit: Payer: Self-pay | Admitting: Psychiatry

## 2019-01-09 NOTE — Telephone Encounter (Signed)
Clarification, not sure this is current regimen for her?

## 2019-04-15 ENCOUNTER — Other Ambulatory Visit: Payer: Self-pay | Admitting: Obstetrics and Gynecology

## 2019-04-15 ENCOUNTER — Ambulatory Visit
Admission: RE | Admit: 2019-04-15 | Discharge: 2019-04-15 | Disposition: A | Discharge status: Home / Self Care | Payer: PRIVATE HEALTH INSURANCE | Source: Ambulatory Visit | Attending: Obstetrics and Gynecology | Admitting: Obstetrics and Gynecology

## 2019-04-15 ENCOUNTER — Other Ambulatory Visit: Payer: Self-pay

## 2019-04-15 DIAGNOSIS — N6489 Other specified disorders of breast: Secondary | ICD-10-CM

## 2019-04-24 ENCOUNTER — Other Ambulatory Visit: Payer: Self-pay | Admitting: Psychiatry

## 2019-05-20 ENCOUNTER — Ambulatory Visit: Payer: PRIVATE HEALTH INSURANCE | Admitting: Psychiatry

## 2019-05-22 ENCOUNTER — Encounter: Payer: Self-pay | Admitting: Psychiatry

## 2019-05-22 ENCOUNTER — Telehealth (INDEPENDENT_AMBULATORY_CARE_PROVIDER_SITE_OTHER): Payer: 59 | Admitting: Psychiatry

## 2019-05-22 DIAGNOSIS — F422 Mixed obsessional thoughts and acts: Secondary | ICD-10-CM

## 2019-05-22 NOTE — Progress Notes (Signed)
[facility] Counselor/Therapist Progress Note  Patient ID: Robin Daniel, MRN: [id number],    Date: 05/23/2019  Time Spent: 50 minutes   Treatment Type: Individual Therapy  Reported Symptoms: anxiety  Mental Status Exam:  Appearance:   Well Groomed     Behavior:  Appropriate  Motor:  Normal  Speech/Language:   Clear and Coherent  Affect:  Appropriate  Mood:  anxious  Thought process:  normal  Thought content:    WNL  Sensory/Perceptual disturbances:    WNL  Orientation:  oriented to person, place, time/date and situation  Attention:  Good  Concentration:  Good  Memory:  WNL  Fund of knowledge:   Good  Insight:    Good  Judgment:   Good  Impulse Control:  Good   Risk Assessment: Danger to Self:  No Self-injurious Behavior: No Danger to Others: No Duty to Warn:no Physical Aggression / Violence:No  Access to Firearms a concern: No  Gang Involvement:No   Subjective: I connected with this patient by an approved telecommunication method (video), with her informed consent, and verifying identity and patient privacy.  I was located at my office and patient at her home.  As needed, we discussed the limitations, risks, and security and privacy concerns associated with telehealth service, including the availability and conditions which currently govern in-person appointments and the possibility that 3rd-party payment Bryceson Grape not be fully guaranteed and she Danylah Holden be responsible for charges.  After she indicated understanding, we proceeded with the session.  Also discussed treatment planning, as needed, including ongoing verbal agreement with the plan, the opportunity to ask and answer all questions, her demonstrated understanding of instructions, and her readiness to call the office should symptoms worsen or she feels she is in a crisis state and needs more immediate and tangible assistance. Ms. saiya, crist are scheduled for a virtual visit with your provider today.    Just  as we do with appointments in the office, we must obtain your consent to participate.  Your consent will be active for this visit and any virtual visit you Jarrod Mcenery have with one of our providers in the next 365 days.    If you have a MyChart account, I can also send a copy of this consent to you electronically.  All virtual visits are billed to your insurance company just like a traditional visit in the office.  As this is a virtual visit, video technology does not allow for your provider to perform a traditional examination.  This Yasiel Goyne limit your provider's ability to fully assess your condition.  If your provider identifies any concerns that need to be evaluated in person or the need to arrange testing such as labs, EKG, etc, we will make arrangements to do so.    Although advances in technology are sophisticated, we cannot ensure that it will always work on either your end or our end.  If the connection with a video visit is poor, we Connor Foxworthy have to switch to a telephone visit.  With either a video or telephone visit, we are not always able to ensure that we have a secure connection.   I need to obtain your verbal consent now.   Are you willing to proceed with your visit today?   Ereka Hardin Negus Fernandez has provided verbal consent on 05/23/2019 for a virtual visit (video or telephone).   Albertina Parr Crissy Mccreadie, [facility] 05/23/2019  7:45 AM   The client states that she pulled her son out of the charter  school.  He was falling behind and she and her husband were concerned that when school started in person he would not be up to grade level.  They have decided to start home schooling him and the client feels good about doing that. Today the client's concern was how much she was "freaking out" about a child at church that had lice.  This child was in her son's youth group.  She began to obsess that her son might have lice or that she might have lice.  "I took it to the next level."  I discussed with the client the need to  control her thought process.  She agreed that this was difficult to do.  I went over mindfulness practices with her and also emailed her handout detailing that.  I also sent handout on mindful cognitive behavior therapy.  How to use that to help restructure her thoughts.  The client feels that this could be helpful.  She wants to be seen in person so we can do some EMDR around her anxiety.  We discussed the need to practice more mood independent behavior and staying in the present tense.  I explained when she goes to the future and worries and rooms are present tense but nothing is happened.  If she goes to the past it wounds are present tense but nothing is happened.  The client agreed and will focus on this.  Interventions: Mindfulness Meditation, Motivational Interviewing, Solution-Oriented/Positive Psychology and Insight-Oriented  Diagnosis:   ICD-10-CM   1. Mixed obsessional thoughts and acts  F42.2     Plan: Review handouts, practice mindfulness, restructuring negative thoughts, self-care, positive self talk.  Lilliemae Fruge, [facility]

## 2019-06-01 ENCOUNTER — Other Ambulatory Visit: Payer: Self-pay | Admitting: Psychiatry

## 2019-06-12 ENCOUNTER — Encounter: Payer: Self-pay | Admitting: Psychiatry

## 2019-06-12 ENCOUNTER — Telehealth (INDEPENDENT_AMBULATORY_CARE_PROVIDER_SITE_OTHER): Payer: 59 | Admitting: Psychiatry

## 2019-06-12 DIAGNOSIS — F3181 Bipolar II disorder: Secondary | ICD-10-CM | POA: Diagnosis not present

## 2019-06-12 DIAGNOSIS — F422 Mixed obsessional thoughts and acts: Secondary | ICD-10-CM | POA: Diagnosis not present

## 2019-06-12 DIAGNOSIS — F5105 Insomnia due to other mental disorder: Secondary | ICD-10-CM

## 2019-06-12 DIAGNOSIS — F411 Generalized anxiety disorder: Secondary | ICD-10-CM | POA: Diagnosis not present

## 2019-06-12 MED ORDER — ALPRAZOLAM 1 MG PO TABS: 1.0000 mg | ORAL_TABLET | Freq: Two times a day (BID) | ORAL | 5 refills | Status: DC | PRN

## 2019-06-12 MED ORDER — BUPROPION HCL ER (SR) 150 MG PO TB12: 150.0000 mg | ORAL_TABLET | Freq: Every day | ORAL | 1 refills | Status: DC

## 2019-06-12 MED ORDER — CARBAMAZEPINE 100 MG PO CHEW: 200.0000 mg | CHEWABLE_TABLET | Freq: Every day | ORAL | 0 refills | Status: DC

## 2019-06-12 MED ORDER — CARBAMAZEPINE ER 100 MG PO TB12: 100.0000 mg | ORAL_TABLET | Freq: Every morning | ORAL | 5 refills | Status: DC

## 2019-06-12 MED ORDER — ZOLPIDEM TARTRATE 10 MG PO TABS: 5.0000 mg | ORAL_TABLET | Freq: Every evening | ORAL | 5 refills | Status: DC | PRN

## 2019-06-12 MED ORDER — LAMOTRIGINE 200 MG PO TABS: ORAL_TABLET | ORAL | 1 refills | Status: DC

## 2019-06-12 MED ORDER — CARBAMAZEPINE ER 200 MG PO TB12: ORAL_TABLET | ORAL | 1 refills | Status: DC

## 2019-06-12 NOTE — Progress Notes (Signed)
Robin Daniel [id number] [birth year] 44 y.o.   I connected with  Robin Daniel on 06/12/19 by a video enabled telemedicine application and verified that I am speaking with the correct person using two identifiers.   I discussed the limitations of evaluation and management by telemedicine. The patient expressed understanding and agreed to proceed.    Subjective:   Patient ID:  Robin Daniel is a 44 y.o. (DOB 1975/07/17) female.  Chief Complaint:  Chief Complaint  Patient presents with  . Follow-up    mood and anxiety  . Sleeping Problem    Depression        Associated symptoms include no decreased concentration and no suicidal ideas.  Past medical history includes anxiety.   Anxiety Patient reports no confusion, decreased concentration, nervous/anxious behavior or suicidal ideas.     Robin Daniel call yesterday for an urgent visit because of problems keeping her meds straight with resultant side effect issues.  Follow-up med changes and mood.  At visit Jun 06, 2018.  Insurance problems not covering Equetro resulted in the patient having to switch to a mixture of Tegretol-XR 100 mg tablets, 200 mg Tegretol-XR tablets, and immediate release to 200 mg carbamazepine at night this is resulted in some patient confusion about how to take her medicines and Robin Daniel has called a couple of times since her last appointment having mixed her medications up and having problems.  Last seen October 2020.  The following was noted and no meds were changed. Overall doing well.  No sig mood swings except PMDD.  Anxiety is manageable.  No major mood swings.  Trouble with getting enough fiber.  Taken Miralax for years.   Reduced lamotrigine in August 2020 DT dizziness and the dizziness resolved.  06/12/2019 appointment, the following is noted:  Occ Ambien.  More often alprazolam 0.5 mg HS. Usually good sleep and rested. 7 hours Good except perimenopausal.  Irregular  and heavy periods and can cause her to feel pretty crappy. Avoiding replacement of hormones.  Otherwise good mood most of the time.  Not depressed since here but can feel agitated and bloated. No GI sx since reducing lamotrigine to current dose.  .Patient reports stable mood and denies depressed or irritable moods.  Patient denies difficulty with sleep initiation or maintenance. Still struggles with chronic anxiety and negative thoughts about herself.  Denies appetite disturbance.  Patient reports that energy and motivation have been good.  Patient denies any difficulty with concentration.  Patient denies any suicidal ideation. Also is irritalble and anxious with period.    Past Psychiatric Medication Trials: Carbamazepine 900, lamotrigine XR 200 mg twice daily, Wellbutrin SR 100 mg twice daily, naltrexone, Abilify, sertraline, stimulants caused mania, Trintellix, alprazolam, Ambien, N-acetylcysteine, topiramate, paroxetine weakness, other antidepressants Robin Daniel also has had a good response to the full dose of Equetro.  Review of Systems: No tremor no weakness no nausea vomiting. No dizziness.  Medications: I have reviewed the patient's current medications.  Current Outpatient Medications  Medication Sig Dispense Refill  . bimatoprost (LATISSE) 0.03 % ophthalmic solution PLACE 1 DROP ON APPLICATOR & APPLY EVENLY AT THE BASE OF EYELASHES NIGHTLY ON BOTH EYES.    . levothyroxine (SYNTHROID, LEVOTHROID) 50 MCG tablet Take 1 tablet by mouth every morning.  5  . ALPRAZolam (XANAX) 1 MG tablet Take 1 tablet (1 mg total) by mouth 2 (two) times daily as needed for anxiety. 45 tablet 5  . buPROPion (WELLBUTRIN SR) 150 MG 12 hr tablet Take  1 tablet (150 mg total) by mouth daily. 90 tablet 1  . carbamazepine (TEGRETOL XR) 100 MG 12 hr tablet Take 1 tablet (100 mg total) by mouth in the morning. 30 tablet 5  . carbamazepine (TEGRETOL XR) 200 MG 12 hr tablet 1 in the morning and 2 at night 270 tablet 1  .  carbamazepine (TEGRETOL) 100 MG chewable tablet Chew 2 tablets (200 mg total) by mouth at bedtime. 180 tablet 0  . lamoTRIgine (LAMICTAL) 200 MG tablet 1/2 at lunch and 1 at night 180 tablet 1  . zolpidem (AMBIEN) 10 MG tablet Take 0.5-1 tablets (5-10 mg total) by mouth at bedtime as needed for sleep. 30 tablet 5   No current facility-administered medications for this visit.    Medication Side Effects: None no longer dizziness, nausea  Allergies: No Known Allergies  Past Medical History:  Diagnosis Date  . Bipolar disorder ([place])     Family History  Problem Relation Age of Onset  . Breast cancer Neg Hx     Social History   Socioeconomic History  . Marital status: Married    Spouse name: Not on file  . Number of children: Not on file  . Years of education: Not on file  . Highest education level: Not on file  Occupational History  . Not on file  Tobacco Use  . Smoking status: Never Smoker  . Smokeless tobacco: Never Used  Substance and Sexual Activity  . Alcohol use: No  . Drug use: No  . Sexual activity: Not on file  Other Topics Concern  . Not on file  Social History Narrative  . Not on file   Social Determinants of Health   Financial Resource Strain:   . Difficulty of Paying Living Expenses:   Food Insecurity:   . Worried About Charity fundraiser in the Last Year:   . Arboriculturist in the Last Year:   Transportation Needs:   . Film/video editor (Medical):   Marland Kitchen Lack of Transportation (Non-Medical):   Physical Activity:   . Days of Exercise per Week:   . Minutes of Exercise per Session:   Stress:   . Feeling of Stress :   Social Connections:   . Frequency of Communication with Friends and Family:   . Frequency of Social Gatherings with Friends and Family:   . Attends Religious Services:   . Active Member of Clubs or Organizations:   . Attends Archivist Meetings:   Marland Kitchen Marital Status:   Intimate Partner Violence:   . Fear of Current or  Ex-Partner:   . Emotionally Abused:   Marland Kitchen Physically Abused:   . Sexually Abused:     Past Medical History, Surgical history, Social history, and Family history were reviewed and updated as appropriate.   Please see review of systems for further details on the patient's review from today.   Objective:   Physical Exam:  There were no vitals taken for this visit.  Physical Exam Constitutional:      General: Robin Daniel is not in acute distress.    Appearance: Robin Daniel is well-developed.  Musculoskeletal:        General: No deformity.  Neurological:     Mental Status: Robin Daniel is alert and oriented to person, place, and time.     Cranial Nerves: No dysarthria.     Coordination: Coordination normal.  Psychiatric:        Attention and Perception: Attention normal. Robin Daniel does not perceive auditory  hallucinations.        Mood and Affect: Mood is anxious. Mood is not depressed. Affect is not labile, blunt, flat, angry, tearful or inappropriate.        Speech: Speech normal.        Behavior: Behavior normal. Behavior is cooperative.        Thought Content: Thought content normal. Thought content is not paranoid or delusional. Thought content does not include homicidal or suicidal ideation. Thought content does not include homicidal or suicidal plan.        Cognition and Memory: Cognition and memory normal.        Judgment: Judgment normal.     Comments: Insight good.   Robin Daniel has residual anxiety.     Lab Review:  No results found for: NA, K, CL, CO2, GLUCOSE, BUN, CREATININE, CALCIUM, PROT, ALBUMIN, AST, ALT, ALKPHOS, BILITOT, GFRNONAA, GFRAA     Component Value Date/Time   WBC 16.2 (H) 10/29/2007 0505   RBC 2.70 (L) 10/29/2007 0505   HGB 8.7 DELTA CHECK NOTED (L) 10/29/2007 0505   HCT 25.9 (L) 10/29/2007 0505   PLT 183 10/29/2007 0505   MCV 95.8 10/29/2007 0505   MCHC 33.6 10/29/2007 0505   RDW 14.8 10/29/2007 0505    No results found for: POCLITH, LITHIUM   Lab Results  Component Value Date    CBMZ 11.1 11/09/2017     .res Assessment: Plan:    Robin Daniel was seen today for follow-up and sleeping problem.  Diagnoses and all orders for this visit:  Bipolar II disorder ([place]) -     lamoTRIgine (LAMICTAL) 200 MG tablet; 1/2 at lunch and 1 at night -     buPROPion (WELLBUTRIN SR) 150 MG 12 hr tablet; Take 1 tablet (150 mg total) by mouth daily. -     carbamazepine (TEGRETOL XR) 100 MG 12 hr tablet; Take 1 tablet (100 mg total) by mouth in the morning. -     carbamazepine (TEGRETOL XR) 200 MG 12 hr tablet; 1 in the morning and 2 at night -     carbamazepine (TEGRETOL) 100 MG chewable tablet; Chew 2 tablets (200 mg total) by mouth at bedtime.  Mixed obsessional thoughts and acts  Generalized anxiety disorder -     ALPRAZolam (XANAX) 1 MG tablet; Take 1 tablet (1 mg total) by mouth 2 (two) times daily as needed for anxiety.  Insomnia due to mental condition -     zolpidem (AMBIEN) 10 MG tablet; Take 0.5-1 tablets (5-10 mg total) by mouth at bedtime as needed for sleep.     Greater than 50% of face to face time with patient was spent on counseling and coordination of care. We discussed Patient was stable on 900 mg of Equetro for an extended period of time.  However Robin Daniel eventually started developing side effects of dizziness and nausea and vomiting and could no longer tolerate that dosage.  The dosage was cut back to 600 mg and Robin Daniel has subsequently developed a mixture of obsessive-compulsive and irritable hypomanic symptoms.  Robin Daniel was having unusual intrusive obsessive thoughts which were about lice and then about Covid.  Robin Daniel had been successful at transitioning from [facility] to a mixture of immediate release and extended release carbamazepine.  Robin Daniel was tolerating the 900 mg daily which had markedly reduced the intrusive obsessive thoughts about Covid and other obsessive anxious thoughts.  It had helped to reduce the dysphoric mixed mood symptoms.    Sx are now well controlled.  Patient's  insurance would not pay for Tegretol XR 100 mg tablets 3 in the morning and 4 at night because of quantity limits.  Therefore the prescription was rewritten Tegretol-XR 100 mg tablets 1 in the morning #30 and 1 refill and Tegretol-XR 200 mg tablets 1 in the morning and 2 at night #90 and 1 refill.  Robin Daniel is also to take short acting carbamazepine 100 mg tablets 2 at night.  Continue lamotrigine 100 mg at noon and 200 mg at night for bipolar depression  Continue the Wellbutrin SR 150 mg in the morning as Robin Daniel feels that is helpful for energy and appetite control.  And somewhat for attention and focus.  Continue alternating Xanax and Ambien at night but is also taking it prn for anxiety.  I have okayed number 45/month  Robin Daniel is taking an unusual mix between long-acting and short acting carbamazepine in order to achieve maximum mood benefit but avoid side effects of nausea and dizziness without this combination and also dictated by insurance limitations.  Patient's insurance would not pay for Tegretol XR 100 mg tablets 3 in the morning and 4 at night because of quantity limits.  Therefore the prescription was rewritten Tegretol-XR 100 mg tablets 1 in the morning #30 and 1 refill and Tegretol-XR 200 mg tablets 1 in the morning and 2 at night #90 and 1 refill.  short acting carbamazepine 100 mg tablets 2 at night.  This was explained to the patient in detail a couple of times.  It was also given to her in writing.  We discussed the short-term risks associated with benzodiazepines including sedation and increased fall risk among others.  Discussed long-term side effect risk including dependence, potential withdrawal symptoms, and the potential eventual dose-related risk of dementia.  But recent studies from 2020 dispute this association between benzodiazepines and dementia risk. Newer studies in 2020 do not support an association with dementia.  This appt was 30 mins.  FU 6 mos  Lynder Parents, MD,  DFAPA   Please see After Visit Summary for patient specific instructions.   Future Appointments  Date Time Provider [place]  10/15/2019  8:30 AM [place], [place], MD [facility] None  11/11/2019  7:40 AM [facility] 1 [facility]    No orders of the defined types were placed in this encounter.     -------------------------------

## 2019-08-15 ENCOUNTER — Telehealth: Payer: Self-pay | Admitting: Psychiatry

## 2019-08-15 NOTE — Telephone Encounter (Signed)
Error

## 2019-08-21 ENCOUNTER — Other Ambulatory Visit: Payer: Self-pay

## 2019-08-21 ENCOUNTER — Encounter (HOSPITAL_BASED_OUTPATIENT_CLINIC_OR_DEPARTMENT_OTHER): Payer: Self-pay | Admitting: Surgery

## 2019-08-26 ENCOUNTER — Other Ambulatory Visit (HOSPITAL_COMMUNITY)
Admission: RE | Admit: 2019-08-26 | Discharge: 2019-08-26 | Disposition: A | Discharge status: Home / Self Care | Payer: 59 | Source: Ambulatory Visit | Attending: Surgery | Admitting: Surgery

## 2019-08-26 DIAGNOSIS — Z01812 Encounter for preprocedural laboratory examination: Secondary | ICD-10-CM | POA: Diagnosis present

## 2019-08-26 DIAGNOSIS — Z20822 Contact with and (suspected) exposure to covid-19: Secondary | ICD-10-CM | POA: Diagnosis not present

## 2019-08-26 LAB — SARS CORONAVIRUS 2 (TAT 6-24 HRS): SARS Coronavirus 2: NEGATIVE

## 2019-08-27 NOTE — H&P (Signed)
Chief Complaint:  Atypical nevus of the back  History of Present Illness:  Robin Daniel is an 44 y.o. female here for excision of a previously biopsied nevus.  The patient is a 44 year old female who presents for a melanoma biopsy follow-up. Referred from Dr. 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 This small biopsy site is 15 cm from the top of the clavicle and is 7 cm to the right of the spinous processes. Needs excision with margins.   Past Medical History:  Diagnosis Date  . Bipolar disorder ([place])   . Hypothyroidism     Past Surgical History:  Procedure Laterality Date  . METATARSAL OSTEOTOMY WITH BUNIONECTOMY      No current facility-administered medications for this encounter.   Current Outpatient Medications  Medication Sig Dispense Refill  . bimatoprost (LATISSE) 0.03 % ophthalmic solution PLACE 1 DROP ON APPLICATOR & APPLY EVENLY AT THE BASE OF EYELASHES NIGHTLY ON BOTH EYES.    Marland Kitchen buPROPion (WELLBUTRIN SR) 150 MG 12 hr tablet Take 1 tablet (150 mg total) by mouth daily. 90 tablet 1  . carbamazepine (TEGRETOL XR) 100 MG 12 hr tablet Take 1 tablet (100 mg total) by mouth in the morning. 30 tablet 5  . carbamazepine (TEGRETOL XR) 200 MG 12 hr tablet 1 in the morning and 2 at night 270 tablet 1  . carbamazepine (TEGRETOL) 100 MG chewable tablet Chew 2 tablets (200 mg total) by mouth at bedtime. 180 tablet 0  . lamoTRIgine (LAMICTAL) 200 MG tablet 1/2 at lunch and 1 at night 180 tablet 1  . levothyroxine (SYNTHROID, LEVOTHROID) 50 MCG tablet Take 1 tablet by mouth every morning.  5  . zolpidem (AMBIEN) 10 MG tablet Take 0.5-1 tablets (5-10 mg total) by mouth at bedtime as needed for sleep. 30 tablet 5  . ALPRAZolam (XANAX) 1 MG tablet Take 1 tablet (1 mg total) by mouth 2 (two) times daily as needed for anxiety. 45 tablet 5   Patient has no known allergies. Family  History  Problem Relation Age of Onset  . Breast cancer Neg Hx    Social History:   reports that she has never smoked. She has never used smokeless tobacco. She reports that she does not drink alcohol and does not use drugs.   REVIEW OF SYSTEMS : Negative except for see problem list  Physical Exam:   Height 5' 5"  (1.651 m), weight 53.1 kg, last menstrual period 08/05/2019. Body mass index is 19.47 kg/m.  Gen:  WDWN WF NAD  Neurological: Alert and oriented to person, place, and time. Motor and sensory function is grossly intact  Head: Normocephalic and atraumatic.  Eyes: Conjunctivae are normal. Pupils are equal, round, and reactive to light. No scleral icterus.  Neck: Normal range of motion. Neck supple. No tracheal deviation or thyromegaly present.  Cardiovascular:  SR without murmurs or gallops.  No carotid bruits Breast:  Not examined Respiratory: Effort normal.  No respiratory distress. No chest wall tenderness. Breath sounds normal.  No wheezes, rales or rhonchi.  Abdomen:  nontender GU:  Not examined Musculoskeletal: Normal range of motion. Extremities are nontender. No cyanosis, edema or clubbing noted Lymphadenopathy: No cervical, preauricular, postauricular or axillary adenopathy is present Skin: Skin is warm and dry. No rash noted. No diaphoresis. No erythema. No pallor. 7 cm to the right of the midline and 15 cm from the top of the clavicle is the  area that we marked in the office.  The scar is ~ 4 mm in diameter   Pscyh: Normal mood and affect. Behavior is normal. Judgment and thought content normal.   LABORATORY RESULTS: Results for orders placed or performed during the hospital encounter of 08/26/19 (from the past 48 hour(s))  SARS CORONAVIRUS 2 (TAT 6-24 HRS) Nasopharyngeal Nasopharyngeal Swab     Status: None   Collection Time: 08/26/19  8:39 AM   Specimen: Nasopharyngeal Swab  Result Value Ref Range   SARS Coronavirus 2 NEGATIVE NEGATIVE    Comment:  (NOTE) SARS-CoV-2 target nucleic acids are NOT DETECTED.  The SARS-CoV-2 RNA is generally detectable in upper and lower respiratory specimens during the acute phase of infection. Negative results do not preclude SARS-CoV-2 infection, do not rule out co-infections with other pathogens, and should not be used as the sole basis for treatment or other patient management decisions. Negative results must be combined with clinical observations, patient history, and epidemiological information. The expected result is Negative.  Fact Sheet for Patients: SugarRoll.be  Fact Sheet for Healthcare Providers: https://www.woods-mathews.com/  This test is not yet approved or cleared by the [place] FDA and  has been authorized for detection and/or diagnosis of SARS-CoV-2 by FDA under an Emergency Use Authorization (EUA). This EUA will remain  in effect (meaning this test can be used) for the duration of the COVID-19 declaration under Se ction 564(b)(1) of the Act, 21 U.S.C. section 360bbb-3(b)(1), unless the authorization is terminated or revoked sooner.  Performed at [facility], [place] [address] [place], [place] [address]      RADIOLOGY RESULTS: No results found.  Problem List: Patient Active Problem List   Diagnosis Date Noted  . OCD (obsessive compulsive disorder) 11/25/2017  . Bipolar 2 disorder ([place]) 11/25/2017    Assessment & Plan: Atypical dermal nevoid melanocytic neoplasm --plan excision with margins.      Matt B. Hassell Done, MD, [facility] [phone] beeper [phone]  08/27/2019 1:30 PM

## 2019-08-28 NOTE — Progress Notes (Signed)
° ° ° ° °  Enhanced Recovery after Surgery for Orthopedics Enhanced Recovery after Surgery is a protocol used to improve the stress on your body and your recovery after surgery.  Patient Instructions   The night before surgery:  o No food after midnight. ONLY clear liquids after midnight   The day of surgery (if you do NOT have diabetes):  o Drink ONE (1) Pre-Surgery Clear Ensure as directed.   o This drink was given to you during your hospital  pre-op appointment visit. o The pre-op nurse will instruct you on the time to drink the  Pre-Surgery Ensure depending on your surgery time. o Finish the drink at the designated time by the pre-op nurse.  o Nothing else to drink after completing the  Pre-Surgery Clear Ensure.   The day of surgery (if you have diabetes): o Drink ONE (1) Gatorade 2 (G2) as directed. o This drink was given to you during your hospital  pre-op appointment visit.  o The pre-op nurse will instruct you on the time to drink the   Gatorade 2 (G2) depending on your surgery time. o Color of the Gatorade may vary. Red is not allowed. o Nothing else to drink after completing the  Gatorade 2 (G2).         If you have questions, please contact your surgeons office. Surgical soap given with instructions, pt verbalized understanding.

## 2019-08-29 ENCOUNTER — Encounter (HOSPITAL_BASED_OUTPATIENT_CLINIC_OR_DEPARTMENT_OTHER): Payer: Self-pay | Admitting: Surgery

## 2019-08-29 ENCOUNTER — Ambulatory Visit (HOSPITAL_BASED_OUTPATIENT_CLINIC_OR_DEPARTMENT_OTHER)
Admission: RE | Admit: 2019-08-29 | Discharge: 2019-08-29 | Disposition: A | Discharge status: Home / Self Care | Payer: 59 | Attending: Surgery | Admitting: Surgery

## 2019-08-29 ENCOUNTER — Ambulatory Visit: Payer: Self-pay | Admitting: Surgery

## 2019-08-29 ENCOUNTER — Ambulatory Visit (HOSPITAL_BASED_OUTPATIENT_CLINIC_OR_DEPARTMENT_OTHER): Payer: 59 | Admitting: Anesthesiology

## 2019-08-29 ENCOUNTER — Other Ambulatory Visit: Payer: Self-pay

## 2019-08-29 ENCOUNTER — Encounter (HOSPITAL_BASED_OUTPATIENT_CLINIC_OR_DEPARTMENT_OTHER)
Admission: RE | Disposition: A | Discharge status: Home / Self Care | Payer: Self-pay | Source: Home / Self Care | Attending: Surgery

## 2019-08-29 DIAGNOSIS — D225 Melanocytic nevi of trunk: Secondary | ICD-10-CM | POA: Diagnosis present

## 2019-08-29 DIAGNOSIS — F3181 Bipolar II disorder: Secondary | ICD-10-CM | POA: Insufficient documentation

## 2019-08-29 DIAGNOSIS — Z79899 Other long term (current) drug therapy: Secondary | ICD-10-CM | POA: Insufficient documentation

## 2019-08-29 DIAGNOSIS — E039 Hypothyroidism, unspecified: Secondary | ICD-10-CM | POA: Diagnosis not present

## 2019-08-29 DIAGNOSIS — F419 Anxiety disorder, unspecified: Secondary | ICD-10-CM | POA: Insufficient documentation

## 2019-08-29 DIAGNOSIS — F429 Obsessive-compulsive disorder, unspecified: Secondary | ICD-10-CM | POA: Diagnosis not present

## 2019-08-29 HISTORY — PX: NEVUS EXCISION: SHX5263

## 2019-08-29 HISTORY — DX: Hypothyroidism, unspecified: E03.9

## 2019-08-29 LAB — POCT PREGNANCY, URINE: Preg Test, Ur: NEGATIVE

## 2019-08-29 SURGERY — EXCISION, NEVUS
Anesthesia: General | Site: Back | Laterality: Right

## 2019-08-29 MED ORDER — CEFAZOLIN SODIUM-DEXTROSE 2-4 GM/100ML-% IV SOLN
2.0000 g | INTRAVENOUS | Status: AC
  Administered 2019-08-29: 2 g via INTRAVENOUS

## 2019-08-29 MED ORDER — ACETAMINOPHEN 500 MG PO TABS
ORAL_TABLET | ORAL | Status: AC
  Filled 2019-08-29: qty 2

## 2019-08-29 MED ORDER — PROPOFOL 10 MG/ML IV BOLUS
INTRAVENOUS | Status: AC
  Filled 2019-08-29: qty 20

## 2019-08-29 MED ORDER — BUPIVACAINE HCL (PF) 0.25 % IJ SOLN
INTRAMUSCULAR | Status: DC | PRN
  Administered 2019-08-29: 9 mL

## 2019-08-29 MED ORDER — FENTANYL CITRATE (PF) 100 MCG/2ML IJ SOLN
INTRAMUSCULAR | Status: DC | PRN
  Administered 2019-08-29: 100 ug via INTRAVENOUS

## 2019-08-29 MED ORDER — LACTATED RINGERS IV SOLN: INTRAVENOUS | Status: DC

## 2019-08-29 MED ORDER — MIDAZOLAM HCL 5 MG/5ML IJ SOLN
INTRAMUSCULAR | Status: DC | PRN
  Administered 2019-08-29: 2 mg via INTRAVENOUS

## 2019-08-29 MED ORDER — BUPIVACAINE HCL (PF) 0.25 % IJ SOLN
INTRAMUSCULAR | Status: AC
  Filled 2019-08-29: qty 30

## 2019-08-29 MED ORDER — BUPIVACAINE HCL (PF) 0.5 % IJ SOLN
INTRAMUSCULAR | Status: AC
  Filled 2019-08-29: qty 30

## 2019-08-29 MED ORDER — CEFAZOLIN SODIUM-DEXTROSE 2-4 GM/100ML-% IV SOLN
INTRAVENOUS | Status: AC
  Filled 2019-08-29: qty 100

## 2019-08-29 MED ORDER — ROCURONIUM BROMIDE 100 MG/10ML IV SOLN
INTRAVENOUS | Status: DC | PRN
  Administered 2019-08-29: 60 mg via INTRAVENOUS

## 2019-08-29 MED ORDER — CHLORHEXIDINE GLUCONATE CLOTH 2 % EX PADS: 6.0000 | MEDICATED_PAD | Freq: Once | CUTANEOUS | Status: DC

## 2019-08-29 MED ORDER — OXYCODONE HCL 5 MG/5ML PO SOLN: 5.0000 mg | Freq: Once | ORAL | Status: DC | PRN

## 2019-08-29 MED ORDER — OXYCODONE HCL 5 MG PO TABS: 5.0000 mg | ORAL_TABLET | Freq: Once | ORAL | Status: DC | PRN

## 2019-08-29 MED ORDER — MEPERIDINE HCL 25 MG/ML IJ SOLN: 6.2500 mg | INTRAMUSCULAR | Status: DC | PRN

## 2019-08-29 MED ORDER — PROMETHAZINE HCL 25 MG/ML IJ SOLN: 6.2500 mg | INTRAMUSCULAR | Status: DC | PRN

## 2019-08-29 MED ORDER — FENTANYL CITRATE (PF) 100 MCG/2ML IJ SOLN
INTRAMUSCULAR | Status: AC
  Filled 2019-08-29: qty 2

## 2019-08-29 MED ORDER — PROPOFOL 10 MG/ML IV BOLUS
INTRAVENOUS | Status: DC | PRN
  Administered 2019-08-29: 150 mg via INTRAVENOUS

## 2019-08-29 MED ORDER — AMISULPRIDE (ANTIEMETIC) 5 MG/2ML IV SOLN: 10.0000 mg | Freq: Once | INTRAVENOUS | Status: DC | PRN

## 2019-08-29 MED ORDER — HYDROCODONE-ACETAMINOPHEN 5-325 MG PO TABS
1.0000 | ORAL_TABLET | Freq: Four times a day (QID) | ORAL | 0 refills | Status: DC | PRN
Start: 2019-08-29 — End: 2022-02-21

## 2019-08-29 MED ORDER — ONDANSETRON HCL 4 MG/2ML IJ SOLN
INTRAMUSCULAR | Status: AC
  Filled 2019-08-29: qty 2

## 2019-08-29 MED ORDER — ACETAMINOPHEN 500 MG PO TABS
1000.0000 mg | ORAL_TABLET | ORAL | Status: AC
  Administered 2019-08-29: 1000 mg via ORAL

## 2019-08-29 MED ORDER — ONDANSETRON HCL 4 MG/2ML IJ SOLN
INTRAMUSCULAR | Status: DC | PRN
  Administered 2019-08-29: 4 mg via INTRAVENOUS

## 2019-08-29 MED ORDER — MIDAZOLAM HCL 2 MG/2ML IJ SOLN
INTRAMUSCULAR | Status: AC
  Filled 2019-08-29: qty 2

## 2019-08-29 MED ORDER — SUGAMMADEX SODIUM 200 MG/2ML IV SOLN
INTRAVENOUS | Status: DC | PRN
  Administered 2019-08-29: 125 mg via INTRAVENOUS

## 2019-08-29 MED ORDER — HYDROMORPHONE HCL 1 MG/ML IJ SOLN: 0.2500 mg | INTRAMUSCULAR | Status: DC | PRN

## 2019-08-29 MED ORDER — DEXAMETHASONE SODIUM PHOSPHATE 4 MG/ML IJ SOLN
INTRAMUSCULAR | Status: DC | PRN
  Administered 2019-08-29: 10 mg via INTRAVENOUS

## 2019-08-29 SURGICAL SUPPLY — 50 items
ADH SKN CLS APL DERMABOND .7 (GAUZE/BANDAGES/DRESSINGS) ×1
APL SKNCLS STERI-STRIP NONHPOA (GAUZE/BANDAGES/DRESSINGS)
BENZOIN TINCTURE PRP APPL 2/3 (GAUZE/BANDAGES/DRESSINGS) IMPLANT
BLADE CLIPPER SURG (BLADE) IMPLANT
BLADE SURG 15 STRL LF DISP TIS (BLADE) ×1 IMPLANT
BLADE SURG 15 STRL SS (BLADE) ×2
CANISTER SUCT 1200ML W/VALVE (MISCELLANEOUS) IMPLANT
CLEANER CAUTERY TIP 5X5 PAD (MISCELLANEOUS) ×1 IMPLANT
COVER BACK TABLE 60X90IN (DRAPES) ×2 IMPLANT
COVER MAYO STAND STRL (DRAPES) ×2 IMPLANT
COVER WAND RF STERILE (DRAPES) IMPLANT
DECANTER SPIKE VIAL GLASS SM (MISCELLANEOUS) ×1 IMPLANT
DERMABOND ADVANCED (GAUZE/BANDAGES/DRESSINGS) ×1
DERMABOND ADVANCED .7 DNX12 (GAUZE/BANDAGES/DRESSINGS) IMPLANT
DRAPE LAPAROTOMY 100X72 PEDS (DRAPES) ×1 IMPLANT
DRAPE U-SHAPE 76X120 STRL (DRAPES) IMPLANT
ELECT REM PT RETURN 9FT ADLT (ELECTROSURGICAL) ×2
ELECTRODE REM PT RTRN 9FT ADLT (ELECTROSURGICAL) ×1 IMPLANT
GAUZE SPONGE 4X4 12PLY STRL (GAUZE/BANDAGES/DRESSINGS) ×1 IMPLANT
GAUZE SPONGE 4X4 12PLY STRL LF (GAUZE/BANDAGES/DRESSINGS) ×1 IMPLANT
GLOVE BIO SURGEON STRL SZ8 (GLOVE) ×2 IMPLANT
GOWN STRL REUS W/ TWL LRG LVL3 (GOWN DISPOSABLE) ×1 IMPLANT
GOWN STRL REUS W/ TWL XL LVL3 (GOWN DISPOSABLE) ×1 IMPLANT
GOWN STRL REUS W/TWL LRG LVL3 (GOWN DISPOSABLE) ×2
GOWN STRL REUS W/TWL XL LVL3 (GOWN DISPOSABLE) ×2
NDL HYPO 25X1 1.5 SAFETY (NEEDLE) ×1 IMPLANT
NDL PRECISIONGLIDE 27X1.5 (NEEDLE) IMPLANT
NEEDLE HYPO 25X1 1.5 SAFETY (NEEDLE) ×2 IMPLANT
NEEDLE PRECISIONGLIDE 27X1.5 (NEEDLE) IMPLANT
NS IRRIG 1000ML POUR BTL (IV SOLUTION) IMPLANT
PACK BASIN DAY SURGERY FS (CUSTOM PROCEDURE TRAY) ×2 IMPLANT
PAD CLEANER CAUTERY TIP 5X5 (MISCELLANEOUS) ×1
PENCIL SMOKE EVACUATOR (MISCELLANEOUS) ×2 IMPLANT
STRIP CLOSURE SKIN 1/2X4 (GAUZE/BANDAGES/DRESSINGS) IMPLANT
SUT ETHILON 3 0 FSL (SUTURE) IMPLANT
SUT ETHILON 3 0 PS 1 (SUTURE) ×1 IMPLANT
SUT ETHILON 5 0 PS 2 18 (SUTURE) IMPLANT
SUT MNCRL AB 4-0 PS2 18 (SUTURE) ×1 IMPLANT
SUT VIC AB 2-0 SH 27 (SUTURE) ×2
SUT VIC AB 2-0 SH 27XBRD (SUTURE) IMPLANT
SUT VIC AB 4-0 SH 18 (SUTURE) ×1 IMPLANT
SUT VIC AB 5-0 PS2 18 (SUTURE) IMPLANT
SUT VICRYL 3-0 CR8 SH (SUTURE) IMPLANT
SYR BULB EAR ULCER 3OZ GRN STR (SYRINGE) ×2 IMPLANT
SYR CONTROL 10ML LL (SYRINGE) ×2 IMPLANT
TOWEL GREEN STERILE FF (TOWEL DISPOSABLE) ×2 IMPLANT
TRAY DSU PREP LF (CUSTOM PROCEDURE TRAY) ×2 IMPLANT
TUBE CONNECTING 20X1/4 (TUBING) IMPLANT
UNDERPAD 30X36 HEAVY ABSORB (UNDERPADS AND DIAPERS) IMPLANT
YANKAUER SUCT BULB TIP NO VENT (SUCTIONS) IMPLANT

## 2019-08-29 NOTE — Anesthesia Postprocedure Evaluation (Signed)
Anesthesia Post Note  Patient: Robin Daniel  Procedure(s) Performed: EXCISION OF ATYPICAL NEVUS FROM RIGHT BACK (Right Back)     Patient location during evaluation: PACU Anesthesia Type: General Level of consciousness: awake and alert Pain management: pain level controlled Vital Signs Assessment: post-procedure vital signs reviewed and stable Respiratory status: spontaneous breathing, nonlabored ventilation, respiratory function stable and patient connected to nasal cannula oxygen Cardiovascular status: blood pressure returned to baseline and stable Postop Assessment: no apparent nausea or vomiting Anesthetic complications: no   No complications documented.  Last Vitals:  Vitals:   08/29/19 1345 08/29/19 1400  BP: 103/80 98/70  Pulse: 81 69  Resp: (!) 22 19  Temp:  36.6 C  SpO2: 100% 100%    Last Pain:  Vitals:   08/29/19 1400  TempSrc:   PainSc: 2                  Mattingly Fountaine DAVID

## 2019-08-29 NOTE — Op Note (Signed)
Robin Daniel  25-May-1975   08/29/2019    PCP:  Adin Hector, MD   Surgeon: Kaylyn Lim, MD, FACS  Asst:  none  Anes:  General in prone position  Preop Dx: Atypical nevus possible melanoma Postop Dx: Permanent path pending:  Single suture on the medial margin and two sutures on the superior margin  Procedure: Wide excision of nevus with primary closure in layers Location Surgery: CDS 2 Complications: None noted  EBL:   minimal cc  Drains: none  Description of Procedure:  The patient was taken to OR 2 .  After anesthesia was administered and the patient was prepped  with technicare  and a timeout was performed.  The lesion had been marked with her help in the holding area.  I also measured it as I had measured in the office and there was concordance in the presence of this scar.  It was marked with an indelible marker.  She was taken back to the OR to at [facility] Day surgery given general anesthesia and then placed in the prone position.  After prepping with technique area was draped sterilely.  I then mapped the area and prepare for a transverse incision that was approximately 6 to 7 cm oriented transversely and almost 3 cm in the midportion around the lesion.  This ellipse was then described with a blade and was carried down through the fatty tissue.  Medially I took it down to the muscle layer and then dissected off the muscle with electrocautery.  Likewise the entire ellipse was removed with the electrocautery.  The orientation sutures were placed as mentioned above and then when taken from the patient at that was sent in formalin for permanent sections.  Hemostasis was present.  I used some 2-0 Prolene in a deep fashion to approximate the deep fascia of the back and then use some 4-0 Vicryl in the subcutaneous layer.  I also injected this area with 1% lidocaine plain both before making my incisions and at the end of the excision.  Final closure was performed with a running  subcuticular 4-0 Monocryl and Dermabond on the skin.  The patient tolerated the procedure well and was taken to the PACU in stable condition.     Matt B. Hassell Done, [name], [facility], [place]

## 2019-08-29 NOTE — Interval H&P Note (Signed)
History and Physical Interval Note:  08/29/2019 12:14 PM  Robin Daniel  has presented today for surgery, with the diagnosis of ATYPICAL NEVUS OF BACK.  The various methods of treatment have been discussed with the patient and family. After consideration of risks, benefits and other options for treatment, the patient has consented to  Procedure(s): EXCISION OF ATYPICAL NEVUS FROM RIGHT BACK (Right) as a surgical intervention.  The patient's history has been reviewed, patient examined, no change in status, stable for surgery.  I have reviewed the patient's chart and labs.  Questions were answered to the patient's satisfaction.     Pedro Earls

## 2019-08-29 NOTE — Transfer of Care (Signed)
Immediate Anesthesia Transfer of Care Note  Patient: Robin Daniel  Procedure(s) Performed: EXCISION OF ATYPICAL NEVUS FROM RIGHT BACK (Right Back)  Patient Location: PACU  Anesthesia Type:General  Level of Consciousness: drowsy, patient cooperative and responds to stimulation  Airway & Oxygen Therapy: Patient Spontanous Breathing and Patient connected to face mask oxygen  Post-op Assessment: Report given to RN and Post -op Vital signs reviewed and stable  Post vital signs: Reviewed and stable  Last Vitals:  Vitals Value Taken Time  BP 115/77 08/29/19 1330  Temp    Pulse 91 08/29/19 1330  Resp 17 08/29/19 1330  SpO2 100 % 08/29/19 1330  Vitals shown include unvalidated device data.  Last Pain:  Vitals:   08/29/19 1050  TempSrc: Oral  PainSc: 0-No pain      Patients Stated Pain Goal: 3 (26/20/35 5974)  Complications: No complications documented.

## 2019-08-29 NOTE — Anesthesia Procedure Notes (Signed)
Procedure Name: Intubation Date/Time: 08/29/2019 12:31 PM Performed by: Glory Buff, CRNA Pre-anesthesia Checklist: Patient identified, Emergency Drugs available, Suction available and Patient being monitored Patient Re-evaluated:Patient Re-evaluated prior to induction Oxygen Delivery Method: Circle system utilized Preoxygenation: Pre-oxygenation with 100% oxygen Induction Type: IV induction Ventilation: Mask ventilation without difficulty Laryngoscope Size: Miller and 3 Grade View: Grade I Tube type: Oral Tube size: 7.0 mm Number of attempts: 1 Airway Equipment and Method: Stylet and Oral airway Placement Confirmation: ETT inserted through vocal cords under direct vision,  positive ETCO2 and breath sounds checked- equal and bilateral Secured at: 20 cm Tube secured with: Tape Dental Injury: Teeth and Oropharynx as per pre-operative assessment

## 2019-08-29 NOTE — Discharge Instructions (Signed)
Mole A mole is a colored (pigmented) growth on the skin. Moles are very common. They are usually harmless, but some moles can become cancerous over time. What are the causes? Moles are caused when pigmented skin cells grow together in clusters instead of spreading out in the skin as they normally do. The reason why the skin cells grow together in clusters is not known. What increases the risk? You are more likely to develop a mole if you:  Have family members who have moles.  Are white.  Have blond hair.  Are often outdoors and exposed to the sun.  Received phototherapy when you were a newborn baby.  Are female. What are the signs or symptoms? A mole may be:  [place] or black.  Flat or raised.  Smooth or wrinkled. How is this diagnosed? A mole is diagnosed with a skin exam. If your health care provider thinks a mole may be cancerous, all or part of the mole will be removed for testing (biopsy). How is this treated? Most moles are noncancerous (benign) and do not require treatment. If a mole is found to be cancerous, it will be removed. You may also choose to have a mole removed if it is causing pain or if you do not like the way it looks. Follow these instructions at home: General instructions   Every month, look for new moles and check your existing moles for changes. This is important because a change in a mole can mean that the mole has become cancerous.  ABCDE changes in a mole indicate that you should be evaluated by your health care provider. ABCDE stands for: ? Asymmetry. This means the mole has an irregular shape. It is not round or oval. ? Border. This means the mole has an irregular or bumpy border. ? Color. This means the mole has multiple colors in it, including brown, black, blue, red, or tan. Note that it is normal for moles to get darker when a woman is pregnant or takes birth control pills. ? Diameter. This means the mole is more than 0.2 inches (6 mm)  across. ? Evolving. This refers to any unusual changes or symptoms in the mole, such as pain, itching, stinging, sensitivity, or bleeding.  If you have a large number of moles, see a skin doctor (dermatologist) at least one time every year for a full-body skin check. Lifestyle   When you are outdoors, wear sunscreen with SPF 30 (sun protection factor 30) or higher.  Use an adequate amount of sunscreen to cover exposed areas of skin. Put it on 30 minutes before you go out. Reapply it every 2 hours or anytime you come out of the water.  When you are out in the sun, wear a broad-brimmed hat and clothing that covers your arms and legs. Wear wraparound sunglasses. Contact a health care provider if:  The size, shape, borders, or color of your mole changes.  Your mole, or the skin near the mole, becomes painful, sore, red, or swollen.  Your mole: ? Develops more than one color. ? Itches or bleeds. ? Becomes scaly, sheds skin, or oozes fluid. ? Becomes flat or develops raised areas. ? Becomes hard or soft.  You develop a new mole. Summary  A mole is a colored (pigmented) growth on the skin. Moles are very common. They are usually harmless, but some moles can become cancerous over time.  Every month, look for new moles and check your existing moles for changes. This is important  because a change in a mole can mean that the mole has become cancerous.  If you have a large number of moles, see a skin doctor (dermatologist) at least one time every year for a full-body skin check.  When you are outdoors, wear sunscreen with SPF 30 (sun protection factor 30) or higher. Reapply it every 2 hours or anytime you come out of the water.  Contact a health care provider if you notice changes in a mole or if you develop a new mole. This information is not intended to replace advice given to you by your health care provider. Make sure you discuss any questions you have with your health care  provider. Document Revised: 08/15/2018 Document Reviewed: 06/05/2017 Elsevier Patient Education  [place].  No Tylenol until 5:30pm if needed   Post Anesthesia Home Care Instructions  Activity: Get plenty of rest for the remainder of the day. A responsible individual must stay with you for 24 hours following the procedure.  For the next 24 hours, DO NOT: -Drive a car -Paediatric nurse -Drink alcoholic beverages -Take any medication unless instructed by your physician -Make any legal decisions or sign important papers.  Meals: Start with liquid foods such as gelatin or soup. Progress to regular foods as tolerated. Avoid greasy, spicy, heavy foods. If nausea and/or vomiting occur, drink only clear liquids until the nausea and/or vomiting subsides. Call your physician if vomiting continues.  Special Instructions/Symptoms: Your throat may feel dry or sore from the anesthesia or the breathing tube placed in your throat during surgery. If this causes discomfort, gargle with warm salt water. The discomfort should disappear within 24 hours.  If you had a scopolamine patch placed behind your ear for the management of post- operative nausea and/or vomiting:  1. The medication in the patch is effective for 72 hours, after which it should be removed.  Wrap patch in a tissue and discard in the trash. Wash hands thoroughly with soap and water. 2. You may remove the patch earlier than 72 hours if you experience unpleasant side effects which may include dry mouth, dizziness or visual disturbances. 3. Avoid touching the patch. Wash your hands with soap and water after contact with the patch.

## 2019-08-29 NOTE — Anesthesia Preprocedure Evaluation (Addendum)
Anesthesia Evaluation  Patient identified by MRN, date of birth, ID band Patient awake    Reviewed: Allergy & Precautions, NPO status , Patient's Chart, lab work & pertinent test results  Airway Mallampati: I  TM Distance: >3 FB Neck ROM: Full    Dental   Pulmonary neg pulmonary ROS,    Pulmonary exam normal breath sounds clear to auscultation       Cardiovascular negative cardio ROS Normal cardiovascular exam Rhythm:Regular Rate:Normal     Neuro/Psych Anxiety Bipolar Disorder negative neurological ROS  negative psych ROS   GI/Hepatic negative GI ROS, Neg liver ROS,   Endo/Other  Hypothyroidism   Renal/GU negative Renal ROS  negative genitourinary   Musculoskeletal negative musculoskeletal ROS (+)   Abdominal   Peds negative pediatric ROS (+)  Hematology negative hematology ROS (+)   Anesthesia Other Findings   Reproductive/Obstetrics negative OB ROS                            Anesthesia Physical Anesthesia Plan  ASA: II  Anesthesia Plan: General   Post-op Pain Management:    Induction: Intravenous  PONV Risk Score and Plan: 3 and Ondansetron, Dexamethasone, Midazolam and Treatment may vary due to age or medical condition  Airway Management Planned: Oral ETT  Additional Equipment:   Intra-op Plan:   Post-operative Plan: Extubation in OR  Informed Consent: I have reviewed the patients History and Physical, chart, labs and discussed the procedure including the risks, benefits and alternatives for the proposed anesthesia with the patient or authorized representative who has indicated his/her understanding and acceptance.       Plan Discussed with: CRNA and Surgeon  Anesthesia Plan Comments:        Anesthesia Quick Evaluation

## 2019-09-01 ENCOUNTER — Encounter (HOSPITAL_BASED_OUTPATIENT_CLINIC_OR_DEPARTMENT_OTHER): Payer: Self-pay | Admitting: Surgery

## 2019-09-02 LAB — SURGICAL PATHOLOGY

## 2019-10-15 ENCOUNTER — Encounter: Payer: PRIVATE HEALTH INSURANCE | Admitting: Dermatology

## 2019-11-28 ENCOUNTER — Other Ambulatory Visit: Payer: Self-pay | Admitting: Obstetrics and Gynecology

## 2019-11-28 ENCOUNTER — Ambulatory Visit
Admission: RE | Admit: 2019-11-28 | Discharge: 2019-11-28 | Disposition: A | Discharge status: Home / Self Care | Payer: 59 | Source: Ambulatory Visit | Attending: Obstetrics and Gynecology | Admitting: Obstetrics and Gynecology

## 2019-11-28 DIAGNOSIS — N6489 Other specified disorders of breast: Secondary | ICD-10-CM

## 2019-12-03 ENCOUNTER — Other Ambulatory Visit: Payer: Self-pay | Admitting: Obstetrics and Gynecology

## 2019-12-03 DIAGNOSIS — R928 Other abnormal and inconclusive findings on diagnostic imaging of breast: Secondary | ICD-10-CM

## 2019-12-03 DIAGNOSIS — N6310 Unspecified lump in the right breast, unspecified quadrant: Secondary | ICD-10-CM

## 2019-12-10 ENCOUNTER — Other Ambulatory Visit: Payer: 59

## 2019-12-12 ENCOUNTER — Ambulatory Visit
Admission: RE | Admit: 2019-12-12 | Discharge: 2019-12-12 | Disposition: A | Discharge status: Home / Self Care | Payer: 59 | Source: Ambulatory Visit | Attending: Obstetrics and Gynecology | Admitting: Obstetrics and Gynecology

## 2019-12-12 ENCOUNTER — Other Ambulatory Visit: Payer: Self-pay

## 2019-12-12 ENCOUNTER — Other Ambulatory Visit: Payer: Self-pay | Admitting: Psychiatry

## 2019-12-12 DIAGNOSIS — R928 Other abnormal and inconclusive findings on diagnostic imaging of breast: Secondary | ICD-10-CM | POA: Insufficient documentation

## 2019-12-12 DIAGNOSIS — N6310 Unspecified lump in the right breast, unspecified quadrant: Secondary | ICD-10-CM | POA: Insufficient documentation

## 2019-12-12 DIAGNOSIS — N6489 Other specified disorders of breast: Secondary | ICD-10-CM | POA: Diagnosis present

## 2019-12-12 DIAGNOSIS — F3181 Bipolar II disorder: Secondary | ICD-10-CM

## 2019-12-12 HISTORY — PX: BREAST BIOPSY: SHX20

## 2019-12-12 NOTE — Telephone Encounter (Signed)
Call to RS

## 2019-12-15 ENCOUNTER — Other Ambulatory Visit: Payer: Self-pay | Admitting: Psychiatry

## 2019-12-15 DIAGNOSIS — F3181 Bipolar II disorder: Secondary | ICD-10-CM

## 2019-12-15 LAB — SURGICAL PATHOLOGY

## 2019-12-15 NOTE — Telephone Encounter (Signed)
Has appointment on 02/05/20

## 2019-12-21 ENCOUNTER — Other Ambulatory Visit: Payer: Self-pay | Admitting: Psychiatry

## 2019-12-21 DIAGNOSIS — F3181 Bipolar II disorder: Secondary | ICD-10-CM

## 2019-12-29 ENCOUNTER — Other Ambulatory Visit: Payer: Self-pay | Admitting: Psychiatry

## 2019-12-29 DIAGNOSIS — F3181 Bipolar II disorder: Secondary | ICD-10-CM

## 2020-01-02 ENCOUNTER — Other Ambulatory Visit: Payer: Self-pay | Admitting: Psychiatry

## 2020-01-02 DIAGNOSIS — F3181 Bipolar II disorder: Secondary | ICD-10-CM

## 2020-01-10 ENCOUNTER — Other Ambulatory Visit: Payer: Self-pay | Admitting: Psychiatry

## 2020-01-10 DIAGNOSIS — F5105 Insomnia due to other mental disorder: Secondary | ICD-10-CM

## 2020-01-10 DIAGNOSIS — F411 Generalized anxiety disorder: Secondary | ICD-10-CM

## 2020-01-26 ENCOUNTER — Other Ambulatory Visit: Payer: Self-pay | Admitting: Psychiatry

## 2020-01-26 DIAGNOSIS — F3181 Bipolar II disorder: Secondary | ICD-10-CM

## 2020-02-05 ENCOUNTER — Encounter: Payer: Self-pay | Admitting: Psychiatry

## 2020-02-05 ENCOUNTER — Other Ambulatory Visit: Payer: Self-pay

## 2020-02-05 ENCOUNTER — Ambulatory Visit (INDEPENDENT_AMBULATORY_CARE_PROVIDER_SITE_OTHER): Payer: 59 | Admitting: Psychiatry

## 2020-02-05 DIAGNOSIS — F3181 Bipolar II disorder: Secondary | ICD-10-CM | POA: Diagnosis not present

## 2020-02-05 DIAGNOSIS — F422 Mixed obsessional thoughts and acts: Secondary | ICD-10-CM

## 2020-02-05 DIAGNOSIS — F5105 Insomnia due to other mental disorder: Secondary | ICD-10-CM | POA: Diagnosis not present

## 2020-02-05 DIAGNOSIS — F411 Generalized anxiety disorder: Secondary | ICD-10-CM

## 2020-02-05 MED ORDER — BUPROPION HCL ER (SR) 150 MG PO TB12: 150.0000 mg | ORAL_TABLET | Freq: Every day | ORAL | 1 refills | Status: DC

## 2020-02-05 MED ORDER — CARBAMAZEPINE 100 MG PO CHEW: 200.0000 mg | CHEWABLE_TABLET | Freq: Every day | ORAL | 0 refills | Status: DC

## 2020-02-05 MED ORDER — LAMOTRIGINE 200 MG PO TABS: ORAL_TABLET | ORAL | 1 refills | Status: DC

## 2020-02-05 MED ORDER — ZOLPIDEM TARTRATE 10 MG PO TABS: 5.0000 mg | ORAL_TABLET | Freq: Every evening | ORAL | 5 refills | Status: DC | PRN

## 2020-02-05 MED ORDER — ALPRAZOLAM 1 MG PO TABS: 1.0000 mg | ORAL_TABLET | Freq: Two times a day (BID) | ORAL | 3 refills | Status: DC | PRN

## 2020-02-05 MED ORDER — CARBAMAZEPINE ER 100 MG PO TB12: 100.0000 mg | ORAL_TABLET | Freq: Every morning | ORAL | 0 refills | Status: DC

## 2020-02-05 MED ORDER — CARBAMAZEPINE ER 200 MG PO TB12: ORAL_TABLET | ORAL | 0 refills | Status: DC

## 2020-02-05 NOTE — Progress Notes (Signed)
Robin Daniel [id number] 09/30/1975 45 y.o.   I connected with  Robin Daniel on 02/05/20 by a video enabled telemedicine application and verified that I am speaking with the correct person using two identifiers.   I discussed the limitations of evaluation and management by telemedicine. The patient expressed understanding and agreed to proceed.    Subjective:   Patient ID:  Robin Daniel is a 45 y.o. (DOB 06-27-1975) female.  Chief Complaint:  Chief Complaint  Patient presents with  . Follow-up    Depression        Associated symptoms include no decreased concentration and no suicidal ideas.  Past medical history includes anxiety.   Anxiety Patient reports no confusion, decreased concentration, nervous/anxious behavior or suicidal ideas.     Robin Daniel call yesterday for an urgent visit because of problems keeping her meds straight with resultant side effect issues.  Follow-up med changes and mood.  At visit Jun 06, 2018.  Insurance problems not covering Equetro resulted in the patient having to switch to a mixture of Tegretol-XR 100 mg tablets, 200 mg Tegretol-XR tablets, and immediate release to 200 mg carbamazepine at night this is resulted in some patient confusion about how to take her medicines and she has called a couple of times since her last appointment having mixed her medications up and having problems.  seen October 2020.  The following was noted and no meds were changed. Overall doing well.  No sig mood swings except PMDD.  Anxiety is manageable.  No major mood swings.  Trouble with getting enough fiber.  Taken Miralax for years.   Reduced lamotrigine in August 2020 DT dizziness and the dizziness resolved.  06/12/2019 appointment, the following is noted:  Occ Ambien.  More often alprazolam 0.5 mg HS. Usually good sleep and rested. 7 hours Good except perimenopausal.  Irregular and heavy periods and can cause her to feel  pretty crappy. Avoiding replacement of hormones.  Otherwise good mood most of the time.  Not depressed since here but can feel agitated and bloated. No GI sx since reducing lamotrigine to current dose. Plan no significant med changes.  02/05/2020 appointment with the following noted: Son 34 yo homeschooling since Robin Daniel. Mood "goodish".  Covid last month.  Recovered well except put me in a slump more depressed.  With Covid had HA and nausea and altered taste.   More depressed and ruminating lately too. Also stress medical problems.  Wearing me down.  Anxiety around repeated breast issues.       Patient denies difficulty with sleep initiation or maintenance. Still struggles with chronic anxiety and negative thoughts about herself and lately health related.  Denies appetite disturbance.  Patient reports that energy and motivation have been good.  Patient denies any difficulty with concentration.  Patient denies any suicidal ideation. Also is irritalble and anxious with period.    Past Psychiatric Medication Trials: Carbamazepine 900, lamotrigine XR 200 mg twice daily, Wellbutrin SR 100 mg twice daily, naltrexone, Abilify, sertraline, stimulants caused mania, Trintellix, alprazolam, Ambien, N-acetylcysteine, topiramate, paroxetine weakness, other antidepressants She also has had a good response to the full dose of Equetro.  Review of Systems: No tremor no weakness no nausea vomiting. No dizziness.  Medications: I have reviewed the patient's current medications.  Current Outpatient Medications  Medication Sig Dispense Refill  . bimatoprost (LATISSE) 0.03 % ophthalmic solution PLACE 1 DROP ON APPLICATOR & APPLY EVENLY AT THE BASE OF EYELASHES NIGHTLY ON BOTH EYES.    Marland Kitchen  HYDROcodone-acetaminophen (NORCO/VICODIN) 5-325 MG tablet Take 1 tablet by mouth every 6 (six) hours as needed for moderate pain. 15 tablet 0  . levothyroxine (SYNTHROID, LEVOTHROID) 50 MCG tablet Take 1 tablet by mouth every morning.   5  . ALPRAZolam (XANAX) 1 MG tablet Take 1 tablet (1 mg total) by mouth 2 (two) times daily as needed for anxiety. 45 tablet 3  . buPROPion (WELLBUTRIN SR) 150 MG 12 hr tablet Take 1 tablet (150 mg total) by mouth daily. 90 tablet 1  . carbamazepine (TEGRETOL XR) 100 MG 12 hr tablet Take 1 tablet (100 mg total) by mouth in the morning. 90 tablet 0  . carbamazepine (TEGRETOL XR) 200 MG 12 hr tablet TAKE 1 IN THE MORNING AND 2 AT NIGHT 270 tablet 0  . carbamazepine (TEGRETOL) 100 MG chewable tablet Chew 2 tablets (200 mg total) by mouth at bedtime. 180 tablet 0  . lamoTRIgine (LAMICTAL) 200 MG tablet TAKE 1/2 TABLET BY MOUTH AT LUNCH AND 1 TABLET BY MOUTH AT NIGHT 135 tablet 1  . zolpidem (AMBIEN) 10 MG tablet Take 0.5-1 tablets (5-10 mg total) by mouth at bedtime as needed for sleep. 30 tablet 5   No current facility-administered medications for this visit.    Medication Side Effects: None no longer dizziness, nausea  Allergies: No Known Allergies  Past Medical History:  Diagnosis Date  . Bipolar disorder ([place])   . Hypothyroidism     Family History  Problem Relation Age of Onset  . Breast cancer Neg Hx     Social History   Socioeconomic History  . Marital status: Married    Spouse name: Not on file  . Number of children: Not on file  . Years of education: Not on file  . Highest education level: Not on file  Occupational History  . Not on file  Tobacco Use  . Smoking status: Never Smoker  . Smokeless tobacco: Never Used  Substance and Sexual Activity  . Alcohol use: No  . Drug use: No  . Sexual activity: Not on file  Other Topics Concern  . Not on file  Social History Narrative  . Not on file   Social Determinants of Health   Financial Resource Strain: Not on file  Food Insecurity: Not on file  Transportation Needs: Not on file  Physical Activity: Not on file  Stress: Not on file  Social Connections: Not on file  Intimate Partner Violence: Not on file    Past  Medical History, Surgical history, Social history, and Family history were reviewed and updated as appropriate.   Please see review of systems for further details on the patient's review from today.   Objective:   Physical Exam:  There were no vitals taken for this visit.  Physical Exam Constitutional:      General: She is not in acute distress.    Appearance: She is well-developed.  Musculoskeletal:        General: No deformity.  Neurological:     Mental Status: She is alert and oriented to person, place, and time.     Cranial Nerves: No dysarthria.     Coordination: Coordination normal.  Psychiatric:        Attention and Perception: Attention normal. She does not perceive auditory hallucinations.        Mood and Affect: Mood is anxious and depressed. Affect is not labile, blunt, flat, angry, tearful or inappropriate.        Speech: Speech normal.  Behavior: Behavior normal. Behavior is cooperative.        Thought Content: Thought content normal. Thought content is not paranoid or delusional. Thought content does not include homicidal or suicidal ideation. Thought content does not include homicidal or suicidal plan.        Cognition and Memory: Cognition and memory normal.        Judgment: Judgment normal.     Comments: Insight good.   She has residual anxiety.     Lab Review:  No results found for: NA, K, CL, CO2, GLUCOSE, BUN, CREATININE, CALCIUM, PROT, ALBUMIN, AST, ALT, ALKPHOS, BILITOT, GFRNONAA, GFRAA     Component Value Date/Time   WBC 16.2 (H) 10/29/2007 0505   RBC 2.70 (L) 10/29/2007 0505   HGB 8.7 DELTA CHECK NOTED (L) 10/29/2007 0505   HCT 25.9 (L) 10/29/2007 0505   PLT 183 10/29/2007 0505   MCV 95.8 10/29/2007 0505   MCHC 33.6 10/29/2007 0505   RDW 14.8 10/29/2007 0505    No results found for: POCLITH, LITHIUM   Lab Results  Component Value Date   CBMZ 11.1 11/09/2017     .res Assessment: Plan:    Evania was seen today for  follow-up.  Diagnoses and all orders for this visit:  Bipolar II disorder ([place]) -     buPROPion (WELLBUTRIN SR) 150 MG 12 hr tablet; Take 1 tablet (150 mg total) by mouth daily. -     carbamazepine (TEGRETOL XR) 100 MG 12 hr tablet; Take 1 tablet (100 mg total) by mouth in the morning. -     carbamazepine (TEGRETOL XR) 200 MG 12 hr tablet; TAKE 1 IN THE MORNING AND 2 AT NIGHT -     carbamazepine (TEGRETOL) 100 MG chewable tablet; Chew 2 tablets (200 mg total) by mouth at bedtime. -     lamoTRIgine (LAMICTAL) 200 MG tablet; TAKE 1/2 TABLET BY MOUTH AT LUNCH AND 1 TABLET BY MOUTH AT NIGHT  Mixed obsessional thoughts and acts  Generalized anxiety disorder -     ALPRAZolam (XANAX) 1 MG tablet; Take 1 tablet (1 mg total) by mouth 2 (two) times daily as needed for anxiety.  Insomnia due to mental condition -     zolpidem (AMBIEN) 10 MG tablet; Take 0.5-1 tablets (5-10 mg total) by mouth at bedtime as needed for sleep.     Greater than 50% of face to face time with patient was spent on counseling and coordination of care. We discussed Patient was stable on 900 mg of Equetro for an extended period of time.  However she eventually started developing side effects of dizziness and nausea and vomiting and could no longer tolerate that dosage.  The dosage was cut back to 600 mg and she has subsequently developed a mixture of obsessive-compulsive and irritable hypomanic symptoms.  She was having unusual intrusive obsessive thoughts which were about lice and then about Covid.  She had been successful at transitioning from [facility] to a mixture of immediate release and extended release carbamazepine.  She was tolerating the 900 mg daily which had markedly reduced the intrusive obsessive thoughts about Covid and other obsessive anxious thoughts.  It had helped to reduce the dysphoric mixed mood symptoms.    Sx are now well controlled.  Patient's insurance would not pay for Tegretol XR 100 mg tablets 3 in the  morning and 4 at night because of quantity limits.  Therefore the prescription was rewritten Tegretol-XR 100 mg tablets 1 in the morning #30 and 1  refill and Tegretol-XR 200 mg tablets 1 in the morning and 2 at night #90 and 1 refill.  she is also to take short acting carbamazepine 100 mg tablets 2 at night.  Continue lamotrigine 100 mg at noon and 200 mg at night for bipolar depression  Continue the Wellbutrin SR 150 mg in the morning as she feels that is helpful for energy and appetite control.  And somewhat for attention and focus.  Continue alternating Xanax and Ambien at night but is also taking it prn for anxiety.  I have okayed number 45/month  She is taking an unusual mix between long-acting and short acting carbamazepine in order to achieve maximum mood benefit but avoid side effects of nausea and dizziness without this combination and also dictated by insurance limitations.  Disc immune response and depression probably explains recent increased depression.  Misses some of the lamotrigine.  We discussed the short-term risks associated with benzodiazepines including sedation and increased fall risk among others.  Discussed long-term side effect risk including dependence, potential withdrawal symptoms, and the potential eventual dose-related risk of dementia.  But recent studies from 2020 dispute this association between benzodiazepines and dementia risk. Newer studies in 2020 do not support an association with dementia.  This appt was 30 mins.  FU 6 mos  Lynder Parents, MD, DFAPA   Please see After Visit Summary for patient specific instructions.   Future Appointments  Date Time Provider [place]  02/12/2020  1:00 PM May, [place], [facility] CP-CP None    No orders of the defined types were placed in this encounter.     -------------------------------

## 2020-02-12 ENCOUNTER — Ambulatory Visit: Payer: 59 | Admitting: Psychiatry

## 2020-02-12 ENCOUNTER — Ambulatory Visit (INDEPENDENT_AMBULATORY_CARE_PROVIDER_SITE_OTHER): Payer: 59 | Admitting: Psychiatry

## 2020-02-12 ENCOUNTER — Encounter: Payer: Self-pay | Admitting: Psychiatry

## 2020-02-12 ENCOUNTER — Other Ambulatory Visit: Payer: Self-pay

## 2020-02-12 DIAGNOSIS — F422 Mixed obsessional thoughts and acts: Secondary | ICD-10-CM | POA: Diagnosis not present

## 2020-02-12 NOTE — Progress Notes (Signed)
      [facility] Counselor/Therapist Progress Note  Patient ID: Robin Daniel, MRN: [id number],    Date: 02/12/2020  Time Spent: 45 minutes   Treatment Type: Individual Therapy  Reported Symptoms: anxiety, obsessions, rumination  Mental Status Exam:  Appearance:   Well Groomed     Behavior:  Appropriate  Motor:  Normal  Speech/Language:   Clear and Coherent  Affect:  Appropriate  Mood:  anxious  Thought process:  normal  Thought content:    Obsessions and Rumination  Sensory/Perceptual disturbances:    WNL  Orientation:  oriented to person, place, time/date and situation  Attention:  Good  Concentration:  Good  Memory:  WNL  Fund of knowledge:   Good  Insight:    Good  Judgment:   Good  Impulse Control:  Good   Risk Assessment: Danger to Self:  No Self-injurious Behavior: No Danger to Others: No Duty to Warn:no Physical Aggression / Violence:No  Access to Firearms a concern: No  Gang Involvement:No   Subjective: The client states that the reason she has not been back to therapy was due to an insurance problem.  Even though I was in network for the client the insurance company refused to pay.  "I had to put off treatment until it was resolved."  This past year has been especially difficult for the client.  She had to pathology reports that she describes as botched.  They resulted in 2 surgeries on her back to remove skin lesions that the pathology reported could possibly be cancerous.  Soon after that she had her mammogram where they found a suspicious mass.  "It turned out to be nothing."  Also over Christmas the client and her family contracted COVID-52.  She lost some weight which impacted how she was responding to her Lamictal.  "All of this has made me very stressed out."  Today I used the bilateral stimulation hand pedals with the client as she discussed these issues.  Her subjective units of distress went from a 6+ to less than 3.  She stated that she was able  to get back onto the Lamictal when she gained a little weight back and so far that has been going good.  She feels somewhat lethargic which she attributes to the leftover effects of the COVID-19.  She states that each week is a little better than the last.  She hopes to return to some semblance of normalcy by the end of January.  We discussed increasing her exercise.  Also trying to get daily doses of sunlight when possible.  I also encouraged the client to engage in activities that she finds positively impacting and distracting to help with her overall anxiety.  She states she and her husband are planning on taking a trip to [place], [place] in February.  Interventions: Motivational Interviewing, Solution-Oriented/Positive Psychology, [facility] Desensitization and Reprocessing (EMDR) and Insight-Oriented   Diagnosis:   ICD-10-CM   1. Mixed obsessional thoughts and acts  F42.2     Plan: Mood independent behavior, distraction activities, exercise, sunlight exposure, positive self talk, self-care.  Safira Proffit, [facility]

## 2020-05-23 ENCOUNTER — Other Ambulatory Visit: Payer: Self-pay | Admitting: Psychiatry

## 2020-05-23 DIAGNOSIS — F3181 Bipolar II disorder: Secondary | ICD-10-CM

## 2020-06-07 ENCOUNTER — Other Ambulatory Visit: Payer: Self-pay | Admitting: Psychiatry

## 2020-06-07 DIAGNOSIS — F3181 Bipolar II disorder: Secondary | ICD-10-CM

## 2020-06-24 ENCOUNTER — Other Ambulatory Visit: Payer: Self-pay | Admitting: Obstetrics and Gynecology

## 2020-06-24 DIAGNOSIS — Z1231 Encounter for screening mammogram for malignant neoplasm of breast: Secondary | ICD-10-CM

## 2020-07-07 ENCOUNTER — Encounter: Payer: Self-pay | Admitting: Dermatology

## 2020-07-07 ENCOUNTER — Other Ambulatory Visit: Payer: Self-pay | Admitting: Psychiatry

## 2020-07-07 ENCOUNTER — Ambulatory Visit (INDEPENDENT_AMBULATORY_CARE_PROVIDER_SITE_OTHER): Payer: 59 | Admitting: Dermatology

## 2020-07-07 ENCOUNTER — Other Ambulatory Visit: Payer: Self-pay

## 2020-07-07 DIAGNOSIS — L821 Other seborrheic keratosis: Secondary | ICD-10-CM

## 2020-07-07 DIAGNOSIS — L578 Other skin changes due to chronic exposure to nonionizing radiation: Secondary | ICD-10-CM | POA: Diagnosis not present

## 2020-07-07 DIAGNOSIS — H01119 Allergic dermatitis of unspecified eye, unspecified eyelid: Secondary | ICD-10-CM | POA: Diagnosis not present

## 2020-07-07 DIAGNOSIS — Z1283 Encounter for screening for malignant neoplasm of skin: Secondary | ICD-10-CM | POA: Diagnosis not present

## 2020-07-07 DIAGNOSIS — L814 Other melanin hyperpigmentation: Secondary | ICD-10-CM

## 2020-07-07 DIAGNOSIS — D1800 Hemangioma unspecified site: Secondary | ICD-10-CM

## 2020-07-07 DIAGNOSIS — F3181 Bipolar II disorder: Secondary | ICD-10-CM

## 2020-07-07 DIAGNOSIS — D229 Melanocytic nevi, unspecified: Secondary | ICD-10-CM

## 2020-07-07 MED ORDER — HYDROCORTISONE 2.5 % EX CREA: TOPICAL_CREAM | Freq: Two times a day (BID) | CUTANEOUS | 0 refills | Status: AC | PRN

## 2020-07-07 NOTE — Patient Instructions (Addendum)
[email or web address]   Recommend Serica moisturizing scar formula cream every night or [employer] brand or Mederma silicone scar sheet every night for the first year after a scar appears to help with scar remodeling if desired. Scars remodel on their own for a full year.    Melanoma ABCDEs  Melanoma is the most dangerous type of skin cancer, and is the leading cause of death from skin disease.  You are more likely to develop melanoma if you: Have light-colored skin, light-colored eyes, or red or blond hair Spend a lot of time in the sun Tan regularly, either outdoors or in a tanning bed Have had blistering sunburns, especially during childhood Have a close family member who has had a melanoma Have atypical moles or large birthmarks  Early detection of melanoma is key since treatment is typically straightforward and cure rates are extremely high if we catch it early.   The first sign of melanoma is often a change in a mole or a new dark spot.  The ABCDE system is a way of remembering the signs of melanoma.  A for asymmetry:  The two halves do not match. B for border:  The edges of the growth are irregular. C for color:  A mixture of colors are present instead of an even brown color. D for diameter:  Melanomas are usually (but not always) greater than 76m - the size of a pencil eraser. E for evolution:  The spot keeps changing in size, shape, and color.  Please check your skin once per month between visits. You can use a small mirror in front and a large mirror behind you to keep an eye on the back side or your body.   If you see any new or changing lesions before your next follow-up, please call to schedule a visit.  Please continue daily skin protection including broad spectrum sunscreen SPF 30+ to sun-exposed areas, reapplying every 2 hours as needed when you're outdoors.   Staying in the shade or wearing long sleeves, sun glasses (UVA+UVB protection) and wide brim hats (4-inch  brim around the entire circumference of the hat) are also recommended for sun protection.    Recommend taking Heliocare sun protection supplement daily in sunny weather for additional sun protection. For maximum protection on the sunniest days, you can take up to 2 capsules of regular Heliocare OR take 1 capsule of Heliocare Ultra. For prolonged exposure (such as a full day in the sun), you can repeat your dose of the supplement 4 hours after your first dose. Heliocare [facility] at [facility] at [email or web address]    If you have any questions or concerns for your doctor, please call our main line at [phone] press option 4 to reach your doctor's medical assistant. If no one answers, please leave a voicemail as directed and we will return your call as soon as possible. Messages left after 4 pm will be answered the following business day.   You may also send [place] message via [facility] We typically respond to MyChart messages within 1-2 business days.  For prescription refills, please ask your pharmacy to contact our office. Our fax number is [phone]  If you have an urgent issue when the clinic is closed that cannot wait until the next business day, you can page your doctor at the number below.    Please note that while we do our best to be available for urgent issues outside of office hours, we are not available  24/7.   If you have an urgent issue and are unable to reach [place], you may choose to seek medical care at your doctor's office, retail clinic, urgent care center, or emergency room.  If you have a medical emergency, please immediately call 911 or go to the emergency department.  Pager Numbers  - Dr. Nehemiah Massed: [phone]  - Dr. Laurence Ferrari: [phone]  - Dr. Nicole Kindred: [phone]  In the event of inclement weather, please call our main line at [phone] for an update on the status of any delays or closures.  Dermatology Medication Tips: Please keep the  boxes that topical medications come in in order to help keep track of the instructions about where and how to use these. Pharmacies typically print the medication instructions only on the boxes and not directly on the medication tubes.   If your medication is too expensive, please contact our office at [phone] option 4 or send [place] a message through [place].   We are unable to tell what your co-pay for medications will be in advance as this is different depending on your insurance coverage. However, we may be able to find a substitute medication at lower cost or fill out paperwork to get insurance to cover a needed medication.   If a prior authorization is required to get your medication covered by your insurance company, please allow [place] 1-2 business days to complete this process.  Drug prices often vary depending on where the prescription is filled and some pharmacies may offer cheaper prices.  The website www.goodrx.com contains coupons for medications through different pharmacies. The prices here do not account for what the cost may be with help from insurance (it may be cheaper with your insurance), but the website can give you the price if you did not use any insurance.  - You can print the associated coupon and take it with your prescription to the pharmacy.  - You may also stop by our office during regular business hours and pick up a GoodRx coupon card.  - If you need your prescription sent electronically to a different pharmacy, notify our office through [facility] or by phone at [phone] option 4.

## 2020-07-07 NOTE — Progress Notes (Signed)
Follow-Up Visit   Subjective  Robin Daniel is a 45 y.o. female who presents for the following: Annual Exam (Patient here today for annual tbse. She reports no history of skin cancer. She has concerns with her eyes today when wearing eye shadow.  ).  Patient states her eyes start peeling. She says skin feels like sand paper. She has stopped wearing eye shadow. She would like to discuss with provider.   Patient here for full body skin exam and skin cancer screening.  The following portions of the chart were reviewed this encounter and updated as appropriate:  Tobacco  Allergies  Meds  Problems  Med Hx  Surg Hx  Fam Hx       Objective  Well appearing patient in no apparent distress; mood and affect are within normal limits.  A full examination was performed including scalp, head, eyes, ears, nose, lips, neck, chest, axillae, abdomen, back, buttocks, bilateral upper extremities, bilateral lower extremities, hands, feet, fingers, toes, fingernails, and toenails. All findings within normal limits unless otherwise noted below.   Assessment & Plan  Eyelid dermatitis, allergic/contact bilateral eyelids Chronic condition with expected duration over one year. Condition is bothersome to patient. Not at goal.  Favor allergic contact dermatitis to ingredient in eye shadow   Discussed patch testing. Deferred at this time.   Recommend using www.skinsafeproducts.com and searching for eye shadow marked top allergen free or consider not wearing eyeshadow  Patient has been Aquaphor ointment around eyelids  Prescribed Hydrocortisone 2.5% cream to use twice a day as need for up to 1 week  hydrocortisone 2.5 % cream - bilateral eyelids Apply topically 2 (two) times daily as needed (Rash). Use for up to 1 week. Apply to eyelids for rash  Lentigines - Scattered tan macules - Due to sun exposure - Benign-appering, observe - Recommend daily broad spectrum sunscreen SPF 30+ to  sun-exposed areas, reapply every 2 hours as needed. - Call for any changes  Seborrheic Keratoses - Stuck-on, waxy, tan-brown papules and/or plaques  - Benign-appearing - Discussed benign etiology and prognosis. - Observe - Call for any changes  Melanocytic Nevi - Tan-brown and/or pink-flesh-colored symmetric macules and papules - Benign appearing on exam today - Observation - Call clinic for new or changing moles - Recommend daily use of broad spectrum spf 30+ sunscreen to sun-exposed areas.   Hemangiomas - Red papules - Discussed benign nature - Observe - Call for any changes  Actinic Damage - Chronic condition, secondary to cumulative UV/sun exposure - diffuse scaly erythematous macules with underlying dyspigmentation - Recommend daily broad spectrum sunscreen SPF 30+ to sun-exposed areas, reapply every 2 hours as needed.  - Staying in the shade or wearing long sleeves, sun glasses (UVA+UVB protection) and wide brim hats (4-inch brim around the entire circumference of the hat) are also recommended for sun protection.  - Call for new or changing lesions. Discussed diagnosis in detail including significance of melanoma diagnosis which can be potentially lethal.  Discussed treatment recommendations in detail advising that treatment recommendations are based on longitudinal studies and retrospective studies and are nationwide protocols.  Advised there is always potential for recurrence even after definitive treatment.   History of possible melanoma at back 2021 (Breslow's depth 1.4 mm, mitotic index 6/mm2) - No evidence of recurrence today - No lymphadenopathy - Recommend regular full body skin exams - Recommend daily broad spectrum sunscreen SPF 30+ to sun-exposed areas, reapply every 2 hours as needed.  - Call if any  new or changing lesions are noted between office visits - After definitive treatment, we recommend total-body skin exams every 3 months for a year; then every 4  months for a year; then every 6 months for 3 years.  At 5 years post treatment, if all looks good we would recommend at least yearly total-body skin exams for the rest of your life.  The patient was given time for questions and these were answered.  We recommend frequent self skin examinations; photoprotection with sunscreen, sun protective clothing, hats, sunglasses and sun avoidance.  If the patient notices any new or changing skin lesions the patient should return to the office immediately for evaluation.  - Records requested from [facility]  Skin cancer screening performed today.  Return in about 4 months (around 11/06/2020) for tbse. I, Ruthell Rummage, CMA, am acting as scribe for Forest Gleason, MD.  Documentation: I have reviewed the above documentation for accuracy and completeness, and I agree with the above.  Forest Gleason, MD

## 2020-07-14 ENCOUNTER — Other Ambulatory Visit: Payer: Self-pay | Admitting: Psychiatry

## 2020-07-14 DIAGNOSIS — F3181 Bipolar II disorder: Secondary | ICD-10-CM

## 2020-07-27 ENCOUNTER — Other Ambulatory Visit: Payer: Self-pay

## 2020-07-27 ENCOUNTER — Ambulatory Visit (INDEPENDENT_AMBULATORY_CARE_PROVIDER_SITE_OTHER): Payer: 59 | Admitting: Psychiatry

## 2020-07-27 ENCOUNTER — Encounter: Payer: Self-pay | Admitting: Psychiatry

## 2020-07-27 DIAGNOSIS — F411 Generalized anxiety disorder: Secondary | ICD-10-CM | POA: Diagnosis not present

## 2020-07-27 DIAGNOSIS — F5105 Insomnia due to other mental disorder: Secondary | ICD-10-CM

## 2020-07-27 DIAGNOSIS — F3181 Bipolar II disorder: Secondary | ICD-10-CM

## 2020-07-27 DIAGNOSIS — F422 Mixed obsessional thoughts and acts: Secondary | ICD-10-CM | POA: Diagnosis not present

## 2020-07-27 DIAGNOSIS — F3281 Premenstrual dysphoric disorder: Secondary | ICD-10-CM | POA: Diagnosis not present

## 2020-07-27 MED ORDER — LAMOTRIGINE 200 MG PO TABS: ORAL_TABLET | ORAL | 1 refills | Status: DC

## 2020-07-27 MED ORDER — CARBAMAZEPINE ER 100 MG PO TB12: 100.0000 mg | ORAL_TABLET | Freq: Every morning | ORAL | 0 refills | Status: DC

## 2020-07-27 MED ORDER — CARBAMAZEPINE 100 MG PO CHEW
200.0000 mg | CHEWABLE_TABLET | Freq: Every day | ORAL | 0 refills | Status: DC
Start: 2020-07-27 — End: 2021-01-13

## 2020-07-27 MED ORDER — SERTRALINE HCL 50 MG PO TABS: ORAL_TABLET | ORAL | 1 refills | Status: DC

## 2020-07-27 MED ORDER — ALPRAZOLAM 1 MG PO TABS: 1.0000 mg | ORAL_TABLET | Freq: Two times a day (BID) | ORAL | 3 refills | Status: DC | PRN

## 2020-07-27 MED ORDER — CARBAMAZEPINE ER 200 MG PO TB12
ORAL_TABLET | ORAL | 0 refills | Status: DC
Start: 2020-07-27 — End: 2021-01-06

## 2020-07-27 MED ORDER — ZOLPIDEM TARTRATE 10 MG PO TABS: 5.0000 mg | ORAL_TABLET | Freq: Every evening | ORAL | 5 refills | Status: DC | PRN

## 2020-07-27 MED ORDER — BUPROPION HCL ER (SR) 150 MG PO TB12: 150.0000 mg | ORAL_TABLET | Freq: Every day | ORAL | 1 refills | Status: DC

## 2020-07-27 NOTE — Progress Notes (Signed)
Robin Daniel [id number] 1975-05-24 45 y.o.    Subjective:   Patient ID:  Robin Daniel is a 45 y.o. (DOB 10-Mar-1975) female.  Chief Complaint:  Chief Complaint  Patient presents with   Follow-up    Depression        Associated symptoms include no decreased concentration and no suicidal ideas.  Past medical history includes anxiety.   Anxiety Patient reports no confusion, decreased concentration, nervous/anxious behavior or suicidal ideas.    Robin Daniel call yesterday for an urgent visit because of problems keeping her meds straight with resultant side effect issues.  Follow-up med changes and mood.  At visit Jun 06, 2018.  Insurance problems not covering Equetro resulted in the patient having to switch to a mixture of Tegretol-XR 100 mg tablets, 200 mg Tegretol-XR tablets, and immediate release to 200 mg carbamazepine at night this is resulted in some patient confusion about how to take her medicines and she has called a couple of times since her last appointment having mixed her medications up and having problems.  seen October 2020.  The following was noted and no meds were changed. Overall doing well.  No sig mood swings except PMDD.  Anxiety is manageable.  No major mood swings.  Trouble with getting enough fiber.  Taken Miralax for years.   Reduced lamotrigine in August 2020 DT dizziness and the dizziness resolved.  06/12/2019 appointment, the following is noted:  Occ Ambien.  More often alprazolam 0.5 mg HS. Usually good sleep and rested. 7 hours Good except perimenopausal.  Irregular and heavy periods and can cause her to feel pretty crappy. Avoiding replacement of hormones.  Otherwise good mood most of the time.  Not depressed since here but can feel agitated and bloated. No GI sx since reducing lamotrigine to current dose. Plan no significant med changes.  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 Mood "goodish".  Covid last month.  Recovered well except put me in a slump more depressed.  With Covid had HA and nausea and altered taste.   More depressed and ruminating lately too. Also stress medical problems.  Wearing me down.  Anxiety around repeated breast issues.      Plan: No med changes  07/27/2020 appointment with the following noted: Robin Daniel is 45 yo.   Alternates between alprazolam and Ambien at night to reduce tolerance.  That seems to work. Mood has been good. No complaints about meds.  Cycle is less than in past but only once every few mos.  Still gets some PMS sx.  Irrational thoughts 7 days per month and feels like a failure as mother, wife, irritable.  H notices it. Needs new therapist.  Patient denies difficulty with sleep initiation or maintenance. Still struggles with chronic anxiety and negative thoughts about herself and lately health related.  Denies appetite disturbance.  Patient reports that energy and motivation have been good.  Patient denies any difficulty with concentration.  Patient denies any suicidal ideation. Also is irritalble and anxious with period.    Past Psychiatric Medication Trials: Carbamazepine 900, lamotrigine XR 200 mg twice daily, Wellbutrin SR 100 mg twice daily, naltrexone, Abilify, stimulants caused mania, Trintellix, alprazolam, Ambien, N-acetylcysteine, topiramate,  paroxetine weakness,  sertraline,other antidepressants She also has had a good response to the full dose of Equetro.  Review of Systems: No tremor no weakness no nausea vomiting. No dizziness.  Medications: I have reviewed the patient's current medications.  Current Outpatient Medications  Medication Sig Dispense Refill   bimatoprost (LATISSE) 0.03 % ophthalmic solution PLACE 1 DROP ON APPLICATOR & APPLY EVENLY AT THE BASE OF EYELASHES NIGHTLY ON BOTH EYES.     HYDROcodone-acetaminophen (NORCO/VICODIN) 5-325 MG tablet Take 1 tablet by mouth every 6 (six) hours as needed for  moderate pain. 15 tablet 0   hydrocortisone 2.5 % cream Apply topically 2 (two) times daily as needed (Rash). Use for up to 1 week. Apply to eyelids for rash 3.5 g 0   levothyroxine (SYNTHROID, LEVOTHROID) 50 MCG tablet Take 1 tablet by mouth every morning.  5   sertraline (ZOLOFT) 50 MG tablet 7 days per month for PMS 28 tablet 1   ALPRAZolam (XANAX) 1 MG tablet Take 1 tablet (1 mg total) by mouth 2 (two) times daily as needed for anxiety. 45 tablet 3   buPROPion (WELLBUTRIN SR) 150 MG 12 hr tablet Take 1 tablet (150 mg total) by mouth daily. 90 tablet 1   carbamazepine (TEGRETOL XR) 100 MG 12 hr tablet Take 1 tablet (100 mg total) by mouth in the morning. 90 tablet 0   carbamazepine (TEGRETOL XR) 200 MG 12 hr tablet TAKE 1 TABLET EACH MORNING AND 2 TABLETS EACH NIGHT. 270 tablet 0   carbamazepine (TEGRETOL) 100 MG chewable tablet Chew 2 tablets (200 mg total) by mouth at bedtime. 180 tablet 0   lamoTRIgine (LAMICTAL) 200 MG tablet TAKE 1/2 TABLET BY MOUTH AT LUNCH AND 1 TABLET BY MOUTH AT NIGHT 135 tablet 1   zolpidem (AMBIEN) 10 MG tablet Take 0.5-1 tablets (5-10 mg total) by mouth at bedtime as needed for sleep. 30 tablet 5   No current facility-administered medications for this visit.    Medication Side Effects: None no longer dizziness, nausea  Allergies: No Known Allergies  Past Medical History:  Diagnosis Date   Bipolar disorder ([place])    Hypothyroidism     Family History  Problem Relation Age of Onset   Breast cancer Neg Hx     Social History   Socioeconomic History   Marital status: Married    Spouse name: Not on file   Number of children: Not on file   Years of education: Not on file   Highest education level: Not on file  Occupational History   Not on file  Tobacco Use   Smoking status: Never   Smokeless tobacco: Never  Substance and Sexual Activity   Alcohol use: No   Drug use: No   Sexual activity: Not on file  Other Topics Concern   Not on file  Social  History Narrative   Not on file   Social Determinants of Health   Financial Resource Strain: Not on file  Food Insecurity: Not on file  Transportation Needs: Not on file  Physical Activity: Not on file  Stress: Not on file  Social Connections: Not on file  Intimate Partner Violence: Not on file    Past Medical History, Surgical history, Social history, and Family history were reviewed and updated as appropriate.   Please see review of systems for further details on the patient's review from today.   Objective:   Physical Exam:  There were no vitals taken for this visit.  Physical Exam Constitutional:      General: She is not in acute distress.    Appearance: She is well-developed.  Musculoskeletal:        General: No deformity.  Neurological:     Mental Status: She is alert and oriented to  person, place, and time.     Cranial Nerves: No dysarthria.     Coordination: Coordination normal.  Psychiatric:        Attention and Perception: Attention normal. She does not perceive auditory hallucinations.        Mood and Affect: Mood is anxious. Mood is not depressed. Affect is not labile, blunt, flat, angry, tearful or inappropriate.        Speech: Speech normal.        Behavior: Behavior normal. Behavior is cooperative.        Thought Content: Thought content normal. Thought content is not paranoid or delusional. Thought content does not include homicidal or suicidal ideation. Thought content does not include homicidal or suicidal plan.        Cognition and Memory: Cognition and memory normal.        Judgment: Judgment normal.     Comments: Insight good.   She has residual anxiety.    Lab Review:  No results found for: NA, K, CL, CO2, GLUCOSE, BUN, CREATININE, CALCIUM, PROT, ALBUMIN, AST, ALT, ALKPHOS, BILITOT, GFRNONAA, GFRAA     Component Value Date/Time   WBC 16.2 (H) 10/29/2007 0505   RBC 2.70 (L) 10/29/2007 0505   HGB 8.7 DELTA CHECK NOTED (L) 10/29/2007 0505   HCT  25.9 (L) 10/29/2007 0505   PLT 183 10/29/2007 0505   MCV 95.8 10/29/2007 0505   MCHC 33.6 10/29/2007 0505   RDW 14.8 10/29/2007 0505    No results found for: POCLITH, LITHIUM   Lab Results  Component Value Date   CBMZ 11.1 11/09/2017     .res Assessment: Plan:    Robin Daniel was seen today for follow-up.  Diagnoses and all orders for this visit:  Bipolar II disorder ([place]) -     buPROPion (WELLBUTRIN SR) 150 MG 12 hr tablet; Take 1 tablet (150 mg total) by mouth daily. -     carbamazepine (TEGRETOL XR) 100 MG 12 hr tablet; Take 1 tablet (100 mg total) by mouth in the morning. -     carbamazepine (TEGRETOL XR) 200 MG 12 hr tablet; TAKE 1 TABLET EACH MORNING AND 2 TABLETS EACH NIGHT. -     carbamazepine (TEGRETOL) 100 MG chewable tablet; Chew 2 tablets (200 mg total) by mouth at bedtime. -     lamoTRIgine (LAMICTAL) 200 MG tablet; TAKE 1/2 TABLET BY MOUTH AT LUNCH AND 1 TABLET BY MOUTH AT NIGHT  PMDD (premenstrual dysphoric disorder) -     sertraline (ZOLOFT) 50 MG tablet; 7 days per month for PMS  Generalized anxiety disorder -     ALPRAZolam (XANAX) 1 MG tablet; Take 1 tablet (1 mg total) by mouth 2 (two) times daily as needed for anxiety.  Mixed obsessional thoughts and acts  Insomnia due to mental condition -     zolpidem (AMBIEN) 10 MG tablet; Take 0.5-1 tablets (5-10 mg total) by mouth at bedtime as needed for sleep.    Greater than 50% of face to face time with patient was spent on counseling and coordination of care. We discussed Patient was stable on 900 mg of Equetro for an extended period of time.  However she eventually started developing side effects of dizziness and nausea and vomiting and could no longer tolerate that dosage.  The dosage was cut back to 600 mg and she has subsequently developed a mixture of obsessive-compulsive and irritable hypomanic symptoms.  She was having unusual intrusive obsessive thoughts which were about lice and then about Covid.  She had been  successful at transitioning from [facility] to a mixture of immediate release and extended release carbamazepine.  She was tolerating the 900 mg daily which had markedly reduced the intrusive obsessive thoughts about Covid and other obsessive anxious thoughts.  It had helped to reduce the dysphoric mixed mood symptoms.    Sx are now well controlled  except PMDD  Patient's insurance would not pay for Tegretol XR 100 mg tablets 3 in the morning and 4 at night because of quantity limits.  Therefore the prescription was rewritten Tegretol-XR 100 mg tablets 1 in the morning #30 and 1 refill and Tegretol-XR 200 mg tablets 1 in the morning and 2 at night #90 and 1 refill.  she is also to take short acting carbamazepine 100 mg tablets 2 at night.  Continue lamotrigine 100 mg at noon and 200 mg at night for bipolar depression  Continue the Wellbutrin SR 150 mg in the morning as she feels that is helpful for energy and appetite control.  And somewhat for attention and focus.  Continue alternating Xanax and Ambien at night but is also taking it prn for anxiety.  I have okayed number 45/month  Sertraline 25-50 mg prn PMDD.  She counts days and knows when has sx 3-4 days per month.  She is taking an unusual mix between long-acting and short acting carbamazepine in order to achieve maximum mood benefit but avoid side effects of nausea and dizziness without this combination and also dictated by insurance limitations.  Disc immune response and depression probably explains recent increased depression.  Misses some of the lamotrigine.  We discussed the short-term risks associated with benzodiazepines including sedation and increased fall risk among others.  Discussed long-term side effect risk including dependence, potential withdrawal symptoms, and the potential eventual dose-related risk of dementia.  But recent studies from 2020 dispute this association between benzodiazepines and dementia risk. Newer studies in  2020 do not support an association with dementia.  This appt was 30 mins.  FU 6 mos  Lynder Parents, MD, DFAPA   Please see After Visit Summary for patient specific instructions.   Future Appointments  Date Time Provider [place]  11/10/2020  8:30 AM [place], [place], MD [facility] None  11/29/2020  7:40 AM [facility]    No orders of the defined types were placed in this encounter.     -------------------------------

## 2020-08-05 ENCOUNTER — Ambulatory Visit: Payer: 59 | Admitting: Psychiatry

## 2020-08-23 ENCOUNTER — Other Ambulatory Visit: Payer: Self-pay

## 2020-08-23 ENCOUNTER — Ambulatory Visit (INDEPENDENT_AMBULATORY_CARE_PROVIDER_SITE_OTHER): Payer: 59 | Admitting: Psychiatry

## 2020-08-23 DIAGNOSIS — F422 Mixed obsessional thoughts and acts: Secondary | ICD-10-CM

## 2020-08-23 DIAGNOSIS — F3281 Premenstrual dysphoric disorder: Secondary | ICD-10-CM

## 2020-08-23 DIAGNOSIS — F3181 Bipolar II disorder: Secondary | ICD-10-CM

## 2020-08-23 NOTE — Progress Notes (Signed)
PROBLEM-FOCUSED INITIAL PSYCHOTHERAPY EVALUATION Luan Moore, PhD LP [facility]  Name: Robin Daniel Date: 08/23/2020 Time spent: 50 min MRN: [id number] DOB: 01/28/1975 Guardian/Payee: self  PCP: Adin Hector, MD Documentation requested on this visit: No  PROBLEM HISTORY Reason for Visit /Presenting Problem:  Chief Complaint  Patient presents with   Establish Care   Anxiety   Depression    Narrative/History of Present Illness Says she was referred by Dr. Benay Spice for new therapist to deal with intrusive negative thoughts about herself.  PT reports she has long-identified Bipolar I (chart says type II), has worked with Josph Macho May before his retirement 4/30.  Feeling stable but wants to establish care in case of urgent matters.  Notes she has had longterm pattern of fear of medical issues.  Surgery last year for potential lymphoma, based on pathologist not being very committal about findings.  Repeat history of breast exams that required followup imaging.  No family history of dread diseases, thought she tends to catastrophize.  2012-2013 breakdown, tough year.  First dx BAD in 2014, and pharm treatment has been very effective.  Still struggles with anxiety, not just health but son Museum/gallery curator.  Most potent fears of health issues and lice infestation (never had them, no history with kids).  When affected, gets into obsessive checking of family scalps.  Several weeks ago, a friend of Jack's finally had lice, actually, prompting a wave of checking and sunning household objects.  Got through it, but lately anxiety flaring a little.  Does recognize hypnotic induction, that the more she checks, the more she worries.  Re. moods, no serious depressive episodes in several years.  Will have hormonal episodes, and now has 1-week Zoloft for PMDD.  2013 was the last manic episode.  Does not feel a need to settle or forgive her history but can tear up thinking of it, clearly has  regrets.  Could talk about parents and stresses, but not flaring right now.  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  Knows she had come to see everybody else as the problem, became paranoid.  Grew up feeling things were always her fault, leaves her susceptible still.  Personal affirmations/good news in life come from husband Suezanne Jacquet, sister, son [name].  Suezanne Jacquet tends to keep any woundedness to himself, be nonconfrontational.  Maybe once a year lets out.  No c/o doubting whether he is being forthcoming, just knows he holds back.  [name] is only child, they call him "His Majesty".  Stay home role, home schooling [name], content with it.  Grandparents are very active, supportive, no generational issues noted.  Forecast for her needs, knows she avoids breast exams now, for fear of starting up obsessive checking.  Dermatology is now checking her Q 3 months.    Says Suezanne Jacquet will be seeking therapy also, as both have seen Josph Macho before, hopes he can be seen by [facility] as well.  Has appt with Lanetta Inch, figures they will comparison shop for suitable connection with [facility].  Prior Psychiatric Assessment/Treatment:   Outpatient treatment: Fred [facility], [facility] Psychiatric hospitalization: none stated Psychological assessment/testing: none stated   Abuse/neglect screening: Victim of abuse: Not assessed at this time / none suspected.   Victim of neglect: Not assessed at this time / none suspected.   Perpetrator of abuse/neglect: Not assessed at this time / none suspected.   Witness /  Exposure to Domestic Violence: Not assessed at this time / none suspected.   Witness to Community Violence:  Not assessed at this time / none suspected.   Protective Services Involvement: No.   Report needed: No.    Substance abuse screening: Current substance abuse: Not assessed at this time / none suspected.   History of impactful substance use/abuse: Not assessed at this  time / none suspected.     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  Of note for psychotherapy at this time are skin and breast findings over time that serve as worry triggers. Past Medical History:  Diagnosis Date   Bipolar disorder [facility])    Hypothyroidism      Past Surgical History:  Procedure Laterality Date   BREAST BIOPSY Right 12/12/2019   METATARSAL OSTEOTOMY WITH BUNIONECTOMY     NEVUS EXCISION Right 08/29/2019   Procedure: EXCISION OF ATYPICAL NEVUS FROM RIGHT BACK;  Surgeon: Johnathan Hausen, MD;  Location: [facility];  Service: General;  Laterality: Right;    No Known Allergies  Medications (as listed in Epic): Current Outpatient Medications  Medication Sig Dispense Refill   ALPRAZolam (XANAX) 1 MG tablet Take 1 tablet (1 mg total) by mouth 2 (two) times daily as needed for anxiety. 45 tablet 3   bimatoprost (LATISSE) 0.03 % ophthalmic solution PLACE 1 DROP ON APPLICATOR & APPLY EVENLY AT THE BASE OF EYELASHES NIGHTLY ON BOTH EYES.     buPROPion (WELLBUTRIN SR) 150 MG 12 hr tablet Take 1 tablet (150 mg total) by mouth daily. 90 tablet 1   carbamazepine (TEGRETOL XR) 100 MG 12 hr tablet Take 1 tablet (100 mg total) by mouth in the morning. 90 tablet 0   carbamazepine (TEGRETOL XR) 200 MG 12 hr tablet TAKE 1 TABLET EACH MORNING AND 2 TABLETS EACH NIGHT. 270 tablet 0   carbamazepine  (TEGRETOL) 100 MG chewable tablet Chew 2 tablets (200 mg total) by mouth at bedtime. 180 tablet 0   HYDROcodone-acetaminophen (NORCO/VICODIN) 5-325 MG tablet Take 1 tablet by mouth every 6 (six) hours as needed for moderate pain. 15 tablet 0   hydrocortisone 2.5 % cream Apply topically 2 (two) times daily as needed (Rash). Use for up to 1 week. Apply to eyelids for rash 3.5 g 0   lamoTRIgine (LAMICTAL) 200 MG tablet TAKE 1/2 TABLET BY MOUTH AT LUNCH AND 1 TABLET BY MOUTH AT NIGHT 135 tablet 1   levothyroxine (SYNTHROID, LEVOTHROID) 50 MCG tablet Take 1 tablet by mouth every morning.  5   sertraline (ZOLOFT) 50 MG tablet 7 days per month for PMS 28 tablet 1   zolpidem (AMBIEN) 10 MG tablet Take 0.5-1 tablets (5-10 mg total) by mouth at bedtime as needed for sleep. 30 tablet 5   No current facility-administered medications for this visit.    MENTAL STATUS AND OBSERVATIONS Appearance:   Neat and Well Groomed     Behavior:  Appropriate  Motor:  Normal  Speech/Language:   Clear and Coherent  Affect:  Appropriate  Mood:  normal and background worry  Thought process:  normal  Thought content:    WNL  Sensory/Perceptual disturbances:    WNL  Orientation:  Fully oriented  Attention:  Good  Concentration:  Good  Memory:  WNL  Fund of knowledge:   Good  Insight:    Good  Judgment:   Good  Impulse Control:  Good   Initial Risk Assessment: Danger to self: No Self-injurious behavior: No Danger to others: No Physical aggression / violence: No Duty to warn: No Access to firearms a concern: No Gang involvement: No Patient / guardian was educated about steps to take if suicide or homicide risk level increases between visits: yes While future psychiatric events cannot be accurately predicted, the patient does not currently require acute inpatient psychiatric care and does not currently meet [facility] involuntary commitment criteria.   DIAGNOSIS:    ICD-10-CM   1. 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 PMDD (premenstrual dysphoric disorder)  F32.81       INITIAL TREATMENT: Support/validation provided for distressing symptoms and confirmed rapport Ethical orientation and informed consent confirmed re: privacy rights -- including but not limited to HIPAA, EMR and use of e-PHI patient responsibilities -- scheduling, fair notice of changes, in-person vs. telehealth and regulatory and financial conditions affecting choice expectations for working relationship in psychotherapy needs and consents for working partnerships and exchange of information with other health care providers, especially any medication and other behavioral health providers Initial orientation to cognitive-behavioral and solution-focused therapy approach Psychoeducation and initial recommendations: Affirmed stability gained and overcoming any subtle trauma of having cyclic mood disorder Reviewed fear of lice and similar issues and oriented to E/RP for checking Discussed parallel therapy with husband -- as they have done this before, could be workable, though it is the usual standard for members of a couple to see different therapists to reduce conflict of interest.  However, with clear consent and boundary setting, and a standard that affirms no guarantee of secret-keeping between them, could consent to parallel work.  Main obstacle right now is availability, as new referrals and existing patient needs have both been running high. Outlook for therapy -- scheduling constraints, availability of crisis service, inclusion of family member(s) as appropriate  Plan: Consider whether to embark on E/RP therapy for checking May confer with husband about possibility of parallel therapy, advise later on boundary agreement or engage another therapist without issue Maintain medication as prescribed and work faithfully with relevant prescriber(s) if any changes are desired or  seem indicated Call the clinic on-call service, present to ER, or call 911 if any life-threatening psychiatric crisis Return for time at discretion.  Blanchie Serve, PhD  Luan Moore, PhD LP Clinical Psychologist, [facility] [address] [place], [place] [address] [phone]

## 2020-11-10 ENCOUNTER — Other Ambulatory Visit: Payer: Self-pay

## 2020-11-10 ENCOUNTER — Ambulatory Visit (INDEPENDENT_AMBULATORY_CARE_PROVIDER_SITE_OTHER): Payer: 59 | Admitting: Dermatology

## 2020-11-10 ENCOUNTER — Encounter: Payer: Self-pay | Admitting: Dermatology

## 2020-11-10 DIAGNOSIS — L814 Other melanin hyperpigmentation: Secondary | ICD-10-CM | POA: Diagnosis not present

## 2020-11-10 DIAGNOSIS — L821 Other seborrheic keratosis: Secondary | ICD-10-CM | POA: Diagnosis not present

## 2020-11-10 DIAGNOSIS — Z1283 Encounter for screening for malignant neoplasm of skin: Secondary | ICD-10-CM | POA: Diagnosis not present

## 2020-11-10 DIAGNOSIS — D1800 Hemangioma unspecified site: Secondary | ICD-10-CM

## 2020-11-10 DIAGNOSIS — L910 Hypertrophic scar: Secondary | ICD-10-CM | POA: Diagnosis not present

## 2020-11-10 DIAGNOSIS — L578 Other skin changes due to chronic exposure to nonionizing radiation: Secondary | ICD-10-CM

## 2020-11-10 DIAGNOSIS — D229 Melanocytic nevi, unspecified: Secondary | ICD-10-CM

## 2020-11-10 NOTE — Patient Instructions (Addendum)
Recommend taking Heliocare sun protection supplement daily in sunny weather for additional sun protection. For maximum protection on the sunniest days, you can take up to 2 capsules of regular Heliocare OR take 1 capsule of Heliocare Ultra. For prolonged exposure (such as a full day in the sun), you can repeat your dose of the supplement 4 hours after your first dose. Heliocare can be purchased at [facility] or at [email or web address].    Melanoma ABCDEs  Melanoma is the most dangerous type of skin cancer, and is the leading cause of death from skin disease.  You are more likely to develop melanoma if you: Have light-colored skin, light-colored eyes, or red or blond hair Spend a lot of time in the sun Tan regularly, either outdoors or in a tanning bed Have had blistering sunburns, especially during childhood Have a close family member who has had a melanoma Have atypical moles or large birthmarks  Early detection of melanoma is key since treatment is typically straightforward and cure rates are extremely high if we catch it early.   The first sign of melanoma is often a change in a mole or a new dark spot.  The ABCDE system is a way of remembering the signs of melanoma.  A for asymmetry:  The two halves do not match. B for border:  The edges of the growth are irregular. C for color:  A mixture of colors are present instead of an even brown color. D for diameter:  Melanomas are usually (but not always) greater than 73m - the size of a pencil eraser. E for evolution:  The spot keeps changing in size, shape, and color.  Please check your skin once per month between visits. You can use a small mirror in front and a large mirror behind you to keep an eye on the back side or your body.   If you see any new or changing lesions before your next follow-up, please call to schedule a visit.  Please continue daily skin protection including broad spectrum sunscreen SPF 30+ to sun-exposed areas,  reapplying every 2 hours as needed when you're outdoors.   Staying in the shade or wearing long sleeves, sun glasses (UVA+UVB protection) and wide brim hats (4-inch brim around the entire circumference of the hat) are also recommended for sun protection.    Recommend taking Heliocare sun protection supplement daily in sunny weather for additional sun protection. For maximum protection on the sunniest days, you can take up to 2 capsules of regular Heliocare OR take 1 capsule of Heliocare Ultra. For prolonged exposure (such as a full day in the sun), you can repeat your dose of the supplement 4 hours after your first dose. [facility]ocare [facility] at [email or web address]    If you have any questions or concerns for your doctor, please call our main line at [phone] press option 4 to reach your doctor's medical assistant. If no one answers, please leave a voicemail as directed and we will return your call as soon as possible. Messages left after 4 pm will be answered the following business day.   You may also send [place] message via [facility] We typically respond to MyChart messages within 1-2 business days.  For prescription refills, please ask your pharmacy to contact our office. Our fax number is [phone]  If you have an urgent issue when the clinic is closed that cannot wait until the next business day, you can page your doctor  at the number below.    Please note that while we do our best to be available for urgent issues outside of office hours, we are not available 24/7.   If you have an urgent issue and are unable to reach [place], you may choose to seek medical care at your doctor's office, retail clinic, urgent care center, or emergency room.  If you have a medical emergency, please immediately call 911 or go to the emergency department.  Pager Numbers  - Dr. Nehemiah Massed: [phone]  - Dr. Laurence Ferrari: [phone]  - Dr. Nicole Kindred: [phone]  In the event  of inclement weather, please call our main line at [phone] for an update on the status of any delays or closures.  Dermatology Medication Tips: Please keep the boxes that topical medications come in in order to help keep track of the instructions about where and how to use these. Pharmacies typically print the medication instructions only on the boxes and not directly on the medication tubes.   If your medication is too expensive, please contact our office at [phone] option 4 or send [place] a message through [place].   We are unable to tell what your co-pay for medications will be in advance as this is different depending on your insurance coverage. However, we may be able to find a substitute medication at lower cost or fill out paperwork to get insurance to cover a needed medication.   If a prior authorization is required to get your medication covered by your insurance company, please allow [place] 1-2 business days to complete this process.  Drug prices often vary depending on where the prescription is filled and some pharmacies may offer cheaper prices.  The website www.goodrx.com contains coupons for medications through different pharmacies. The prices here do not account for what the cost may be with help from insurance (it may be cheaper with your insurance), but the website can give you the price if you did not use any insurance.  - You can print the associated coupon and take it with your prescription to the pharmacy.  - You may also stop by our office during regular business hours and pick up a GoodRx coupon card.  - If you need your prescription sent electronically to a different pharmacy, notify our office through [facility] or by phone at [phone] option 4.

## 2020-11-10 NOTE — Progress Notes (Signed)
Follow-Up Visit   Subjective  Robin Daniel is a 45 y.o. female who presents for the following: Follow-up (4 month follow up and tbse. Patient reports her eye dermatitis has subsided. She no longer wears eye make up. Patient would like to discuss treatment for scar at back today. ).  Patient here for full body skin exam and skin cancer screening.  The following portions of the chart were reviewed this encounter and updated as appropriate:  Tobacco  Allergies  Meds  Problems  Med Hx  Surg Hx  Fam Hx      Review of Systems: No other skin or systemic complaints except as noted in HPI or Assessment and Plan.   Objective  Well appearing patient in no apparent distress; mood and affect are within normal limits.  A full examination was performed including scalp, head, eyes, ears, nose, lips, neck, chest, axillae, abdomen, back, buttocks, bilateral upper extremities, bilateral lower extremities, hands, feet, fingers, toes, fingernails, and toenails. All findings within normal limits unless otherwise noted below.  Right Upper Back Hypertrophic scar  Assessment & Plan  Hypertrophic scar Right Upper Back  Intralesional injection - Right Upper Back Location: right upper back  Informed Consent: Discussed risks (infection, pain, bleeding, bruising, thinning of the skin, loss of skin pigment, lack of resolution, and recurrence of lesion) and benefits of the procedure, as well as the alternatives. Informed consent was obtained. Preparation: The area was prepared a standard fashion.  Procedure Details: An intralesional injection was performed with Kenalog 7.5 mg/ml. 0.8 cc in total were injected.  Total number of injections: 4  Plan: The patient was instructed on post-op care. Recommend OTC analgesia as needed for pain.  Lentigines - Scattered tan macules - Due to sun exposure - Benign-appearing, observe - Recommend daily broad spectrum sunscreen SPF 30+ to sun-exposed  areas, reapply every 2 hours as needed. - Call for any changes  Seborrheic Keratoses - Stuck-on, waxy, tan-brown papules and/or plaques  - Benign-appearing - Discussed benign etiology and prognosis. - Observe - Call for any changes  Melanocytic Nevi - Tan-brown and/or pink-flesh-colored symmetric macules and papules - Benign appearing on exam today - Observation - Call clinic for new or changing moles - Recommend daily use of broad spectrum spf 30+ sunscreen to sun-exposed areas.   Hemangiomas - Red papules - Discussed benign nature - Observe - Call for any changes  Actinic Damage - Chronic condition, secondary to cumulative UV/sun exposure - diffuse scaly erythematous macules with underlying dyspigmentation - Recommend daily broad spectrum sunscreen SPF 30+ to sun-exposed areas, reapply every 2 hours as needed.  - Staying in the shade or wearing long sleeves, sun glasses (UVA+UVB protection) and wide brim hats (4-inch brim around the entire circumference of the hat) are also recommended for sun protection.  - Call for new or changing lesions.  History of possible melanoma at back 2021 (Breslow's depth 1.4 mm, mitotic index 6/mm2) - No evidence of recurrence today - No lymphadenopathy - Recommend regular full body skin exams - Recommend daily broad spectrum sunscreen SPF 30+ to sun-exposed areas, reapply every 2 hours as needed.  - Call if any new or changing lesions are noted between office visits - After definitive treatment, we recommend total-body skin exams every 3 months for a year; then every 4 months for a year; then every 6 months for 3 years.  At 5 years post treatment, if all looks good we would recommend at least yearly total-body skin exams for  the rest of your life.  The patient was given time for questions and these were answered.  We recommend frequent self skin examinations; photoprotection with sunscreen, sun protective clothing, hats, sunglasses and sun  avoidance.  If the patient notices any new or changing skin lesions the patient should return to the office immediately for evaluation.  - Records requested from [facility]  Skin cancer screening performed today.  Return for 4 - 5 months follow up tbse . I, Ruthell Rummage, CMA, am acting as scribe for Forest Gleason, MD.  Documentation: I have reviewed the above documentation for accuracy and completeness, and I agree with the above.  Forest Gleason, MD

## 2020-11-29 ENCOUNTER — Ambulatory Visit
Admission: RE | Admit: 2020-11-29 | Discharge: 2020-11-29 | Disposition: A | Discharge status: Home / Self Care | Payer: 59 | Source: Ambulatory Visit | Attending: Obstetrics and Gynecology | Admitting: Obstetrics and Gynecology

## 2020-11-29 ENCOUNTER — Other Ambulatory Visit: Payer: Self-pay

## 2020-11-29 DIAGNOSIS — Z1231 Encounter for screening mammogram for malignant neoplasm of breast: Secondary | ICD-10-CM | POA: Diagnosis present

## 2021-01-05 ENCOUNTER — Other Ambulatory Visit: Payer: Self-pay | Admitting: Psychiatry

## 2021-01-05 DIAGNOSIS — F3281 Premenstrual dysphoric disorder: Secondary | ICD-10-CM

## 2021-01-06 ENCOUNTER — Other Ambulatory Visit: Payer: Self-pay | Admitting: Psychiatry

## 2021-01-06 DIAGNOSIS — F3181 Bipolar II disorder: Secondary | ICD-10-CM

## 2021-01-10 ENCOUNTER — Other Ambulatory Visit: Payer: Self-pay | Admitting: Psychiatry

## 2021-01-10 DIAGNOSIS — F3181 Bipolar II disorder: Secondary | ICD-10-CM

## 2021-01-13 ENCOUNTER — Other Ambulatory Visit: Payer: Self-pay | Admitting: Psychiatry

## 2021-01-13 DIAGNOSIS — F3181 Bipolar II disorder: Secondary | ICD-10-CM

## 2021-01-22 ENCOUNTER — Other Ambulatory Visit: Payer: Self-pay | Admitting: Psychiatry

## 2021-01-22 DIAGNOSIS — F3181 Bipolar II disorder: Secondary | ICD-10-CM

## 2021-01-25 ENCOUNTER — Other Ambulatory Visit: Payer: Self-pay | Admitting: Psychiatry

## 2021-01-25 DIAGNOSIS — F3281 Premenstrual dysphoric disorder: Secondary | ICD-10-CM

## 2021-01-25 NOTE — Telephone Encounter (Signed)
Patient has an appt with Dr. Clovis Pu, tomorrow, 1/4.

## 2021-01-26 ENCOUNTER — Encounter: Payer: Self-pay | Admitting: Psychiatry

## 2021-01-26 ENCOUNTER — Other Ambulatory Visit: Payer: Self-pay | Admitting: Psychiatry

## 2021-01-26 ENCOUNTER — Other Ambulatory Visit: Payer: Self-pay

## 2021-01-26 ENCOUNTER — Ambulatory Visit (INDEPENDENT_AMBULATORY_CARE_PROVIDER_SITE_OTHER): Payer: 59 | Admitting: Psychiatry

## 2021-01-26 DIAGNOSIS — F411 Generalized anxiety disorder: Secondary | ICD-10-CM

## 2021-01-26 DIAGNOSIS — F422 Mixed obsessional thoughts and acts: Secondary | ICD-10-CM | POA: Diagnosis not present

## 2021-01-26 DIAGNOSIS — F3281 Premenstrual dysphoric disorder: Secondary | ICD-10-CM | POA: Diagnosis not present

## 2021-01-26 DIAGNOSIS — F5105 Insomnia due to other mental disorder: Secondary | ICD-10-CM

## 2021-01-26 DIAGNOSIS — F3181 Bipolar II disorder: Secondary | ICD-10-CM | POA: Diagnosis not present

## 2021-01-26 DIAGNOSIS — R69 Illness, unspecified: Secondary | ICD-10-CM | POA: Diagnosis not present

## 2021-01-26 NOTE — Progress Notes (Signed)
Robin Daniel [id number] 1975-11-28 46 y.o.    Subjective:   Patient ID:  Robin Daniel is a 46 y.o. (DOB [birth year]) female.  Chief Complaint:  Chief Complaint  Patient presents with   Follow-up    Bipolar II disorder ([place])    Depression        Associated symptoms include no decreased concentration and no suicidal ideas.  Past medical history includes anxiety.   Anxiety Patient reports no confusion, decreased concentration, nervous/anxious behavior or suicidal ideas.    Robin Daniel call yesterday for an urgent visit because of problems keeping her meds straight with resultant side effect issues.  Follow-up med changes and mood.  At visit Jun 06, 2018.  Insurance problems not covering Equetro resulted in the patient having to switch to a mixture of Tegretol-XR 100 mg tablets, 200 mg Tegretol-XR tablets, and immediate release to 200 mg carbamazepine at night this is resulted in some patient confusion about how to take her medicines and she has called a couple of times since her last appointment having mixed her medications up and having problems.  seen October 2020.  The following was noted and no meds were changed. Overall doing well.  No sig mood swings except PMDD.  Anxiety is manageable.  No major mood swings.  Trouble with getting enough fiber.  Taken Miralax for years.   Reduced lamotrigine in August 2020 DT dizziness and the dizziness resolved.  06/12/2019 appointment, the following is noted:  Occ Ambien.  More often alprazolam 0.5 mg HS. Usually good sleep and rested. 7 hours Good except perimenopausal.  Irregular and heavy periods and can cause her to feel pretty crappy. Avoiding replacement of hormones.  Otherwise good mood most of the time.  Not depressed since here but can feel agitated and bloated. No GI sx since reducing lamotrigine to current dose. Plan no significant med changes.  02/05/2020 appointment with the following noted: Son 7  yo homeschooling since Robin Daniel. Mood "goodish".  Covid last month.  Recovered well except put me in a slump more depressed.  With Covid had HA and nausea and altered taste.   More depressed and ruminating lately too. Also stress medical problems.  Wearing me down.  Anxiety around repeated breast issues.      Plan: No med changes  07/27/2020 appointment with the following noted: Robin Daniel is 47 yo.   Alternates between alprazolam and Ambien at night to reduce tolerance.  That seems to work. Mood has been good. No complaints about meds.  Cycle is less than in past but only once every few mos.  Still gets some PMS sx.  Irrational thoughts 7 days per month and feels like a failure as mother, wife, irritable.  H notices it. Needs new therapist. Patient denies difficulty with sleep initiation or maintenance. Still struggles with chronic anxiety and negative thoughts about herself and lately health related.  Denies appetite disturbance.  Patient reports that energy and motivation have been good.  Patient denies any difficulty with concentration.  Patient denies any suicidal ideation. Also is irritalble and anxious with period.    01/26/2021 appt noted: Good for the most part.  Covid early December and prednisone for a month then menstrual c ycle.  Don't feel as well.  Struggling more with fear and death thoughts.  Yesterday really tired.  Appetite on and off.  Mind goes straight to a dark place with worry and spirals. Not felt well for a couple of weeks and hates it.  Worries over it.  Fearful of medical evaluations and sx.  Normal mammogram.  Chronic medical worry.  How am I going to handle it if something happens for real.  Past Psychiatric Medication Trials: Carbamazepine 900, lamotrigine XR 200 mg twice daily, Wellbutrin SR 100 mg twice daily,  naltrexone, Abilify,  Trintellix, paroxetine weakness,  sertraline,other antidepressants stimulats caused mania,  alprazolam, Ambien, N-acetylcysteine, topiramate,    She also has had a good response to the full dose of Equetro.  Review of Systems: No tremor no weakness no nausea vomiting. No dizziness. Fatigue lately.  Medications: I have reviewed the patient's current medications.  Current Outpatient Medications  Medication Sig Dispense Refill   ALPRAZolam (XANAX) 1 MG tablet Take 1 tablet (1 mg total) by mouth 2 (two) times daily as needed for anxiety. 45 tablet 3   bimatoprost (LATISSE) 0.03 % ophthalmic solution PLACE 1 DROP ON APPLICATOR & APPLY EVENLY AT THE BASE OF EYELASHES NIGHTLY ON BOTH EYES.     buPROPion (WELLBUTRIN SR) 150 MG 12 hr tablet Take 1 tablet (150 mg total) by mouth daily. 90 tablet 1   carbamazepine (TEGRETOL XR) 100 MG 12 hr tablet TAKE 1 TABLET (100 MG TOTAL) BY MOUTH IN THE MORNING. 90 tablet 0   carbamazepine (TEGRETOL XR) 200 MG 12 hr tablet TAKE 1 TABLET EACH MORNING AND 2 TABLETS EACH NIGHT. 270 tablet 0   carbamazepine (TEGRETOL) 100 MG chewable tablet CHEW 2 TABLETS (200 MG TOTAL) BY MOUTH AT BEDTIME. 180 tablet 0   hydrocortisone 2.5 % cream Apply topically 2 (two) times daily as needed (Rash). Use for up to 1 week. Apply to eyelids for rash 3.5 g 0   lamoTRIgine (LAMICTAL) 200 MG tablet TAKE 1/2 TABLET BY MOUTH AT LUNCH AND 1 TABLET BY MOUTH AT NIGHT 135 tablet 1   levothyroxine (SYNTHROID, LEVOTHROID) 50 MCG tablet Take 1 tablet by mouth every morning.  5   sertraline (ZOLOFT) 50 MG tablet TAKE 7 DAYS PER MONTH FOR PMS 21 tablet 0   zolpidem (AMBIEN) 10 MG tablet Take 0.5-1 tablets (5-10 mg total) by mouth at bedtime as needed for sleep. 30 tablet 5   HYDROcodone-acetaminophen (NORCO/VICODIN) 5-325 MG tablet Take 1 tablet by mouth every 6 (six) hours as needed for moderate pain. (Patient not taking: Reported on 01/26/2021) 15 tablet 0   No current facility-administered medications for this visit.    Medication Side Effects: None no longer dizziness, nausea  Allergies: No Known Allergies  Past Medical History:   Diagnosis Date   Bipolar disorder ([place])    Hypothyroidism     Family History  Problem Relation Age of Onset   Breast cancer Neg Hx     Social History   Socioeconomic History   Marital status: Married    Spouse name: Not on file   Number of children: Not on file   Years of education: Not on file   Highest education level: Not on file  Occupational History   Not on file  Tobacco Use   Smoking status: Never   Smokeless tobacco: Never  Substance and Sexual Activity   Alcohol use: No   Drug use: No   Sexual activity: Not on file  Other Topics Concern   Not on file  Social History Narrative   Not on file   Social Determinants of Health   Financial Resource Strain: Not on file  Food Insecurity: Not on file  Transportation Needs: Not on file  Physical Activity: Not on file  Stress: Not on file  Social Connections: Not on file  Intimate Partner Violence: Not on file    Past Medical History, Surgical history, Social history, and Family history were reviewed and updated as appropriate.   Please see review of systems for further details on the patient's review from today.   Objective:   Physical Exam:  There were no vitals taken for this visit.  Physical Exam Constitutional:      General: She is not in acute distress.    Appearance: She is well-developed.  Musculoskeletal:        General: No deformity.  Neurological:     Mental Status: She is alert and oriented to person, place, and time.     Cranial Nerves: No dysarthria.     Coordination: Coordination normal.  Psychiatric:        Attention and Perception: Attention normal. She does not perceive auditory hallucinations.        Mood and Affect: Mood is anxious. Mood is not depressed. Affect is not labile, blunt, flat, angry, tearful or inappropriate.        Speech: Speech normal.        Behavior: Behavior normal. Behavior is cooperative.        Thought Content: Thought content normal. Thought content is not  paranoid or delusional. Thought content does not include homicidal or suicidal ideation. Thought content does not include suicidal plan.        Cognition and Memory: Cognition and memory normal.        Judgment: Judgment normal.     Comments: Insight good.   She has residual anxiety worse with trigger of Covid Exaggerated medical fears and obsessions.    Lab Review:  No results found for: NA, K, CL, CO2, GLUCOSE, BUN, CREATININE, CALCIUM, PROT, ALBUMIN, AST, ALT, ALKPHOS, BILITOT, GFRNONAA, GFRAA     Component Value Date/Time   WBC 16.2 (H) 10/29/2007 0505   RBC 2.70 (L) 10/29/2007 0505   HGB 8.7 DELTA CHECK NOTED (L) 10/29/2007 0505   HCT 25.9 (L) 10/29/2007 0505   PLT 183 10/29/2007 0505   MCV 95.8 10/29/2007 0505   MCHC 33.6 10/29/2007 0505   RDW 14.8 10/29/2007 0505    No results found for: POCLITH, LITHIUM   Lab Results  Component Value Date   CBMZ 11.1 11/09/2017     .res Assessment: Plan:    Robin Daniel was seen today for follow-up.  Diagnoses and all orders for this visit:  Bipolar II disorder ([place])  OCD - safety and infestation focused  Generalized anxiety disorder  PMDD (premenstrual dysphoric disorder)  Insomnia due to mental condition     Greater than 50% of face to face time with patient was spent on counseling and coordination of care. We discussed Patient was stable on 900 mg of Equetro for an extended period of time.  However she eventually started developing side effects of dizziness and nausea and vomiting and could no longer tolerate that dosage.  The dosage was cut back to 600 mg and she has subsequently developed a mixture of obsessive-compulsive and irritable hypomanic symptoms.  She was having unusual intrusive obsessive thoughts which were about lice and then about Covid.  She had been successful at transitioning from [facility] to a mixture of immediate release and extended release carbamazepine.  She was tolerating the 900 mg daily which had markedly  reduced the intrusive obsessive thoughts about Covid and other obsessive anxious thoughts.  It had helped to reduce the dysphoric mixed mood  symptoms.    Sx are now well controlled  except PMDD  Patient's insurance would not pay for Tegretol XR 100 mg tablets 3 in the morning and 4 at night because of quantity limits.  Therefore the prescription was rewritten Tegretol-XR 100 mg tablets 1 in the morning #30 and 1 refill and Tegretol-XR 200 mg tablets 1 in the morning and 2 at night #90 and 1 refill.  she is also to take short acting carbamazepine 100 mg tablets 2 at night.  Continue lamotrigine 100 mg at noon and 200 mg at night for bipolar depression  Continue the Wellbutrin SR 150 mg in the morning as she feels that is helpful for energy and appetite control.  And somewhat for attention and focus.  Continue alternating Xanax and Ambien at night but is also taking it prn for anxiety.  I have okayed number 45/month  Sertraline 25-50 mg prn PMDD.  She counts days and knows when has sx 3-4 days per month.  She is taking an unusual mix between long-acting and short acting carbamazepine in order to achieve maximum mood benefit but avoid side effects of nausea and dizziness without this combination and also dictated by insurance limitations.  Disc immune response and depression probably explains recent increased depression.  Misses some of the lamotrigine.  We discussed the short-term risks associated with benzodiazepines including sedation and increased fall risk among others.  Discussed long-term side effect risk including dependence, potential withdrawal symptoms, and the potential eventual dose-related risk of dementia.  But recent studies from 2020 dispute this association between benzodiazepines and dementia risk. Newer studies in 2020 do not support an association with dementia.  This appt was 30 mins.  FU 4 weeks  Robin Parents, MD, DFAPA   Please see After Visit Summary for patient  specific instructions.   Future Appointments  Date Time Provider [place]  02/03/2021  9:00 AM Rubie Maid, MD [facility] None  03/16/2021  8:20 AM Laurence Ferrari, [place], MD [facility] None    No orders of the defined types were placed in this encounter.      -------------------------------

## 2021-02-03 ENCOUNTER — Ambulatory Visit: Payer: 59 | Admitting: Obstetrics and Gynecology

## 2021-02-03 ENCOUNTER — Other Ambulatory Visit: Payer: Self-pay

## 2021-02-03 ENCOUNTER — Encounter: Payer: Self-pay | Admitting: Obstetrics and Gynecology

## 2021-02-03 VITALS — BP 112/74 | HR 72 | Ht 64.0 in | Wt 116.0 lb

## 2021-02-03 DIAGNOSIS — F3181 Bipolar II disorder: Secondary | ICD-10-CM | POA: Diagnosis not present

## 2021-02-03 DIAGNOSIS — N951 Menopausal and female climacteric states: Secondary | ICD-10-CM | POA: Diagnosis not present

## 2021-02-03 DIAGNOSIS — Z7689 Persons encountering health services in other specified circumstances: Secondary | ICD-10-CM | POA: Diagnosis not present

## 2021-02-03 DIAGNOSIS — F419 Anxiety disorder, unspecified: Secondary | ICD-10-CM

## 2021-02-03 DIAGNOSIS — Z01419 Encounter for gynecological examination (general) (routine) without abnormal findings: Secondary | ICD-10-CM

## 2021-02-03 DIAGNOSIS — R69 Illness, unspecified: Secondary | ICD-10-CM | POA: Diagnosis not present

## 2021-02-03 DIAGNOSIS — F429 Obsessive-compulsive disorder, unspecified: Secondary | ICD-10-CM

## 2021-02-03 NOTE — Progress Notes (Signed)
GYNECOLOGY ANNUAL PHYSICAL EXAM PROGRESS NOTE  Subjective:    Robin Daniel is a 46 y.o. G1P1001 married female who presents to establish care, and for an annual exam. Transitioning from [facility] in [place], [place]. The patient has no complaints today, however notes that she has significant anxiety with pelvic exams and meeting new providers. The patient is sexually active. The patient participates in regular exercise: yes. Has the patient ever been transfused or tattooed?: no. The patient reports that there is not domestic violence in her life.    Menstrual History: Menarche age: 78 Patient's last menstrual period was 01/22/2021.  Cycle lasting 10-14 days. Notes that she will also skip cycles for 1-2 months.  This has been ongoing for the past 2 years.    Gynecologic History:  Contraception: vasectomy History of STI's: Denies Last Pap: November 2021 per patient. Results were: normal.  Denies h/o abnormal pap smears. Last mammogram: 11/29/2020. Results were: normal. Previous h/o abnormal mammograms for the past several years, had biopsy performed 11/28/2019.     OB History  No obstetric history on file.    Past Medical History:  Diagnosis Date   Bipolar disorder ([place])    Hypothyroidism     Past Surgical History:  Procedure Laterality Date   BREAST BIOPSY Right 12/12/2019   Benign   METATARSAL OSTEOTOMY WITH BUNIONECTOMY     NEVUS EXCISION Right 08/29/2019   Procedure: EXCISION OF ATYPICAL NEVUS FROM RIGHT BACK;  Surgeon: Johnathan Hausen, MD;  Location: [facility];  Service: General;  Laterality: Right;    Family History  Problem Relation Age of Onset   Breast cancer Neg Hx     Social History   Socioeconomic History   Marital status: Married    Spouse name: Not on file   Number of children: Not on file   Years of education: Not on file   Highest education level: Not on file  Occupational History   Not on file  Tobacco Use    Smoking status: Never   Smokeless tobacco: Never  Substance and Sexual Activity   Alcohol use: No   Drug use: No   Sexual activity: Not on file  Other Topics Concern   Not on file  Social History Narrative   Not on file   Social Determinants of Health   Financial Resource Strain: Not on file  Food Insecurity: Not on file  Transportation Needs: Not on file  Physical Activity: Not on file  Stress: Not on file  Social Connections: Not on file  Intimate Partner Violence: Not on file    Current Outpatient Medications on File Prior to Visit  Medication Sig Dispense Refill   ALPRAZolam (XANAX) 1 MG tablet TAKE 1 TABLET BY MOUTH 2 TIMES DAILY AS NEEDED FOR ANXIETY. 45 tablet 1   bimatoprost (LATISSE) 0.03 % ophthalmic solution PLACE 1 DROP ON APPLICATOR & APPLY EVENLY AT THE BASE OF EYELASHES NIGHTLY ON BOTH EYES.     buPROPion (WELLBUTRIN SR) 150 MG 12 hr tablet TAKE 1 TABLET BY MOUTH EVERY DAY 90 tablet 1   carbamazepine (TEGRETOL XR) 100 MG 12 hr tablet TAKE 1 TABLET (100 MG TOTAL) BY MOUTH IN THE MORNING. 90 tablet 0   carbamazepine (TEGRETOL XR) 200 MG 12 hr tablet TAKE 1 TABLET EACH MORNING AND 2 TABLETS EACH NIGHT. 270 tablet 0   carbamazepine (TEGRETOL) 100 MG chewable tablet CHEW 2 TABLETS (200 MG TOTAL) BY MOUTH AT BEDTIME. 180 tablet 0  HYDROcodone-acetaminophen (NORCO/VICODIN) 5-325 MG tablet Take 1 tablet by mouth every 6 (six) hours as needed for moderate pain. 15 tablet 0   hydrocortisone 2.5 % cream Apply topically 2 (two) times daily as needed (Rash). Use for up to 1 week. Apply to eyelids for rash 3.5 g 0   lamoTRIgine (LAMICTAL) 200 MG tablet TAKE 1/2 TABLET BY MOUTH AT LUNCH AND 1 TABLET BY MOUTH AT NIGHT 135 tablet 1   levothyroxine (SYNTHROID, LEVOTHROID) 50 MCG tablet Take 1 tablet by mouth every morning.  5   sertraline (ZOLOFT) 50 MG tablet TAKE 7 DAYS PER MONTH FOR PMS 21 tablet 0   zolpidem (AMBIEN) 10 MG tablet Take 0.5-1 tablets (5-10 mg total) by mouth at  bedtime as needed for sleep. 30 tablet 5   No current facility-administered medications on file prior to visit.    No Known Allergies   Review of Systems Constitutional: negative for chills, fatigue, fevers and sweats Eyes: negative for irritation, redness and visual disturbance Ears, nose, mouth, throat, and face: negative for hearing loss, nasal congestion, snoring and tinnitus Respiratory: negative for asthma, cough, sputum Cardiovascular: negative for chest pain, dyspnea, exertional chest pressure/discomfort, irregular heart beat, palpitations and syncope Gastrointestinal: negative for abdominal pain, change in bowel habits, nausea and vomiting Genitourinary: negative for abnormal menstrual periods, genital lesions, sexual problems and vaginal discharge, dysuria and urinary incontinence Integument/breast: negative for breast lump, breast tenderness and nipple discharge Hematologic/lymphatic: negative for bleeding and easy bruising Musculoskeletal:negative for back pain and muscle weakness Neurological: negative for dizziness, headaches, vertigo and weakness Endocrine: negative for diabetic symptoms including polydipsia, polyuria and skin dryness Allergic/Immunologic: negative for hay fever and urticaria Psychological: positive for - anxiety negative for - depression, irritability, or suicidal ideation    Objective:  Height 5' 4"  (1.626 m), weight 116 lb (52.6 kg). Body mass index is 19.91 kg/m.    General Appearance:    Alert, cooperative, no distress, appears stated age  Head:    Normocephalic, without obvious abnormality, atraumatic  Eyes:    PERRL, conjunctiva/corneas clear, EOM's intact, both eyes  Ears:    Normal external ear canals, both ears  Nose:   Nares normal, septum midline, mucosa normal, no drainage or sinus tenderness  Throat:   Lips, mucosa, and tongue normal; teeth and gums normal  Neck:   Supple, symmetrical, trachea midline, no adenopathy; thyroid: no  enlargement/tenderness/nodules; no carotid bruit or JVD  Back:     Symmetric, no curvature, ROM normal, no CVA tenderness  Lungs:     Clear to auscultation bilaterally, respirations unlabored  Chest Wall:    No tenderness or deformity   Heart:    Regular rate and rhythm, S1 and S2 normal, no murmur, rub or gallop  Breast Exam:    No tenderness, masses, or nipple abnormality  Abdomen:     Soft, non-tender, bowel sounds active all four quadrants, no masses, no organomegaly.    Genitalia:    Pelvic:external genitalia normal, vagina without lesions, discharge, or tenderness, rectovaginal septum  normal. Cervix normal in appearance, no cervical motion tenderness, no adnexal masses or tenderness.  Uterus normal size, shape, mobile, regular contours, nontender.  Rectal:    Normal external sphincter.  No hemorrhoids appreciated. Internal exam not done.   Extremities:   Extremities normal, atraumatic, no cyanosis or edema  Pulses:   2+ and symmetric all extremities  Skin:   Skin color, texture, turgor normal, no rashes or lesions  Lymph nodes:   Cervical, supraclavicular,  and axillary nodes normal  Neurologic:   CNII-XII intact, normal strength, sensation and reflexes throughout   .  Labs:  Labs by PCP in August.    Assessment:   1. Well woman exam with routine gynecological exam   2. Encounter to establish care   3. Anxiety   4. Bipolar 2 disorder ([place])   5. Obsessive-compulsive disorder, unspecified type   6. Perimenopausal      Plan:  Blood tests: None ordered. Has labs by PCP. Breast self exam technique reviewed and patient encouraged to perform self-exam monthly. Contraception: vasectomy. Discussed healthy lifestyle modifications. Mammogram  up to date. Continue routine screening.  Colon screening: patient has Cologuard kit at home, just has not completed.  Pap smear  up to date.  Next due in 2024 . COVID vaccination status: Has received Pfizer vaccination series.  Flu vaccine: up  to date.  Anxiety, Bipolar disorder, and OCD managed by Psych.  Can request records from previous OB/GYN.  Discussed perimenopause, advised that if symptoms worsen and desires intervention, can discuss further.  Follow up in 1 year for annual exam   Rubie Maid, MD [facility]

## 2021-02-14 DIAGNOSIS — H93299 Other abnormal auditory perceptions, unspecified ear: Secondary | ICD-10-CM | POA: Diagnosis not present

## 2021-02-14 DIAGNOSIS — Z1211 Encounter for screening for malignant neoplasm of colon: Secondary | ICD-10-CM | POA: Diagnosis not present

## 2021-02-14 DIAGNOSIS — J309 Allergic rhinitis, unspecified: Secondary | ICD-10-CM | POA: Diagnosis not present

## 2021-02-21 LAB — COLOGUARD: COLOGUARD: NEGATIVE

## 2021-03-04 ENCOUNTER — Ambulatory Visit (INDEPENDENT_AMBULATORY_CARE_PROVIDER_SITE_OTHER): Payer: 59 | Admitting: Psychiatry

## 2021-03-04 ENCOUNTER — Encounter: Payer: Self-pay | Admitting: Psychiatry

## 2021-03-04 ENCOUNTER — Other Ambulatory Visit: Payer: Self-pay

## 2021-03-04 DIAGNOSIS — F3181 Bipolar II disorder: Secondary | ICD-10-CM | POA: Diagnosis not present

## 2021-03-04 DIAGNOSIS — F411 Generalized anxiety disorder: Secondary | ICD-10-CM | POA: Diagnosis not present

## 2021-03-04 DIAGNOSIS — F5105 Insomnia due to other mental disorder: Secondary | ICD-10-CM | POA: Diagnosis not present

## 2021-03-04 DIAGNOSIS — F3281 Premenstrual dysphoric disorder: Secondary | ICD-10-CM

## 2021-03-04 DIAGNOSIS — R69 Illness, unspecified: Secondary | ICD-10-CM | POA: Diagnosis not present

## 2021-03-04 DIAGNOSIS — F422 Mixed obsessional thoughts and acts: Secondary | ICD-10-CM | POA: Diagnosis not present

## 2021-03-04 MED ORDER — SERTRALINE HCL 50 MG PO TABS: 50.0000 mg | ORAL_TABLET | Freq: Every day | ORAL | 2 refills | Status: DC

## 2021-03-04 NOTE — Progress Notes (Signed)
Robin Daniel [id number] [birth year] 46 y.o.    Subjective:   Patient ID:  Robin Daniel is a 46 y.o. (DOB 18-Sep-1975) female.  Chief Complaint:  Chief Complaint  Patient presents with   Follow-up    Bipolar II disorder ([place])    Depression        Associated symptoms include no decreased concentration and no suicidal ideas.  Past medical history includes anxiety.   Anxiety Patient reports no confusion, decreased concentration, nervous/anxious behavior or suicidal ideas.    Robin Daniel call yesterday for an urgent visit because of problems keeping her meds straight with resultant side effect issues.  Follow-up med changes and mood.  At visit Jun 06, 2018.  Insurance problems not covering Equetro resulted in the patient having to switch to a mixture of Tegretol-XR 100 mg tablets, 200 mg Tegretol-XR tablets, and immediate release to 200 mg carbamazepine at night this is resulted in some patient confusion about how to take her medicines and she has called a couple of times since her last appointment having mixed her medications up and having problems.  seen October 2020.  The following was noted and no meds were changed. Overall doing well.  No sig mood swings except PMDD.  Anxiety is manageable.  No major mood swings.  Trouble with getting enough fiber.  Taken Miralax for years.   Reduced lamotrigine in August 2020 DT dizziness and the dizziness resolved.  06/12/2019 appointment, the following is noted:  Occ Ambien.  More often alprazolam 0.5 mg HS. Usually good sleep and rested. 7 hours Good except perimenopausal.  Irregular and heavy periods and can cause her to feel pretty crappy. Avoiding replacement of hormones.  Otherwise good mood most of the time.  Not depressed since here but can feel agitated and bloated. No GI sx since reducing lamotrigine to current dose. Plan no significant med changes.  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 Mood "goodish".  Covid last month.  Recovered well except put me in a slump more depressed.  With Covid had HA and nausea and altered taste.   More depressed and ruminating lately too. Also stress medical problems.  Wearing me down.  Anxiety around repeated breast issues.      Plan: No med changes  07/27/2020 appointment with the following noted: Robin Daniel is 46 yo.   Alternates between alprazolam and Ambien at night to reduce tolerance.  That seems to work. Mood has been good. No complaints about meds.  Cycle is less than in past but only once every few mos.  Still gets some PMS sx.  Irrational thoughts 7 days per month and feels like a failure as mother, wife, irritable.  Robin Daniel notices it. Needs new therapist. Patient denies difficulty with sleep initiation or maintenance. Still struggles with chronic anxiety and negative thoughts about herself and lately health related.  Denies appetite disturbance.  Patient reports that energy and motivation have been good.  Patient denies any difficulty with concentration.  Patient denies any suicidal ideation. Also is irritalble and anxious with period.    01/26/2021 appt noted: Good for the most part.  Covid early December and prednisone for a month then menstrual c ycle.  Don't feel as well.  Struggling more with fear and death thoughts.  Yesterday really tired.  Appetite on and off.  Mind goes straight to a dark place with worry and spirals. Not felt well for a couple of weeks and hates it.  Worries over it.  Fearful of medical evaluations and sx.  Normal mammogram.  Chronic medical worry.  How am I going to handle it if something happens for real.  03/04/2021 appt noted:  with Robin Daniel This week seemed to need the sertraline longer.  Become OC about health things.  Started getting  compulsive seeing doctor.  7 days/month was not enough.  Exaggerated somatic fears.  Couldn't reason and was freaking out.  Does see benefit with sertraline markedly.    Robin Daniel sees it as OC.  Seems ok on the other days but the bad days are intense. No mania.    Past Psychiatric Medication Trials: Carbamazepine 900, lamotrigine XR 200 mg twice daily, Wellbutrin SR 100 mg twice daily,  naltrexone, Abilify,  Trintellix, paroxetine weakness,  sertraline,other antidepressants stimulats caused mania,  alprazolam, Ambien, N-acetylcysteine, topiramate,   She also has had a good response to the full dose of Equetro.  Review of Systems: No tremor no weakness no nausea vomiting. No dizziness. Fatigue lately.  Medications: I have reviewed the patient's current medications.  Current Outpatient Medications  Medication Sig Dispense Refill   ALPRAZolam (XANAX) 1 MG tablet TAKE 1 TABLET BY MOUTH 2 TIMES DAILY AS NEEDED FOR ANXIETY. 45 tablet 1   bimatoprost (LATISSE) 0.03 % ophthalmic solution PLACE 1 DROP ON APPLICATOR & APPLY EVENLY AT THE BASE OF EYELASHES NIGHTLY ON BOTH EYES.     buPROPion (WELLBUTRIN SR) 150 MG 12 hr tablet TAKE 1 TABLET BY MOUTH EVERY DAY 90 tablet 1   carbamazepine (TEGRETOL XR) 100 MG 12 hr tablet TAKE 1 TABLET (100 MG TOTAL) BY MOUTH IN THE MORNING. 90 tablet 0   carbamazepine (TEGRETOL XR) 200 MG 12 hr tablet TAKE 1 TABLET EACH MORNING AND 2 TABLETS EACH NIGHT. 270 tablet 0   carbamazepine (TEGRETOL) 100 MG chewable tablet CHEW 2 TABLETS (200 MG TOTAL) BY MOUTH AT BEDTIME. 180 tablet 0   HYDROcodone-acetaminophen (NORCO/VICODIN) 5-325 MG tablet Take 1 tablet by mouth every 6 (six) hours as needed for moderate pain. 15 tablet 0   hydrocortisone 2.5 % cream Apply topically 2 (two) times daily as needed (Rash). Use for up to 1 week. Apply to eyelids for rash 3.5 g 0   lamoTRIgine (LAMICTAL) 200 MG tablet TAKE 1/2 TABLET BY MOUTH AT LUNCH AND 1 TABLET BY MOUTH AT NIGHT 135 tablet 1   levothyroxine (SYNTHROID, LEVOTHROID) 50 MCG tablet Take 1 tablet by mouth every morning.  5   zolpidem (AMBIEN) 10 MG tablet Take 0.5-1 tablets (5-10 mg total) by  mouth at bedtime as needed for sleep. 30 tablet 5   sertraline (ZOLOFT) 50 MG tablet Take 1 tablet (50 mg total) by mouth daily. 30 tablet 2   No current facility-administered medications for this visit.    Medication Side Effects: None no longer dizziness, nausea  Allergies: No Known Allergies  Past Medical History:  Diagnosis Date   Bipolar disorder ([place])    Hypothyroidism     Family History  Problem Relation Age of Onset   Anxiety disorder Father    Breast cancer Neg Hx     Social History   Socioeconomic History   Marital status: Married    Spouse name: Not on file   Number of children: Not on file   Years of education: Not on file   Highest education level: Not on file  Occupational History   Not on file  Tobacco Use   Smoking status: Never   Smokeless tobacco: Never  Substance and Sexual Activity   Alcohol use: No   Drug use: No   Sexual activity: Not on file  Other Topics Concern   Not on file  Social History Narrative   Not on file   Social Determinants of Health   Financial Resource Strain: Not on file  Food Insecurity: Not on file  Transportation Needs: Not on file  Physical Activity: Not on file  Stress: Not on file  Social Connections: Not on file  Intimate Partner Violence: Not on file    Past Medical History, Surgical history, Social history, and Family history were reviewed and updated as appropriate.   Please see review of systems for further details on the patient's review from today.   Objective:   Physical Exam:  There were no vitals taken for this visit.  Physical Exam Constitutional:      General: She is not in acute distress.    Appearance: She is well-developed.  Musculoskeletal:        General: No deformity.  Neurological:     Mental Status: She is alert and oriented to person, place, and time.     Cranial Nerves: No dysarthria.     Coordination: Coordination normal.  Psychiatric:        Attention and Perception:  Attention normal. She does not perceive auditory hallucinations.        Mood and Affect: Mood is anxious. Mood is not depressed. Affect is not labile, blunt, angry, tearful or inappropriate.        Speech: Speech normal.        Behavior: Behavior normal. Behavior is cooperative.        Thought Content: Thought content normal. Thought content is not paranoid or delusional. Thought content does not include homicidal or suicidal ideation. Thought content does not include suicidal plan.        Cognition and Memory: Cognition and memory normal.        Judgment: Judgment normal.     Comments: Insight good.   Episodic health obsessions Exaggerated medical fears and obsessions.    Lab Review:  No results found for: NA, K, CL, CO2, GLUCOSE, BUN, CREATININE, CALCIUM, PROT, ALBUMIN, AST, ALT, ALKPHOS, BILITOT, GFRNONAA, GFRAA     Component Value Date/Time   WBC 16.2 (Robin Daniel) 10/29/2007 0505   RBC 2.70 (L) 10/29/2007 0505   HGB 8.7 DELTA CHECK NOTED (L) 10/29/2007 0505   HCT 25.9 (L) 10/29/2007 0505   PLT 183 10/29/2007 0505   MCV 95.8 10/29/2007 0505   MCHC 33.6 10/29/2007 0505   RDW 14.8 10/29/2007 0505    No results found for: POCLITH, LITHIUM   Lab Results  Component Value Date   CBMZ 11.1 11/09/2017     .res Assessment: Plan:    Deshanna was seen today for follow-up.  Diagnoses and all orders for this visit:  PMDD (premenstrual dysphoric disorder) -     sertraline (ZOLOFT) 50 MG tablet; Take 1 tablet (50 mg total) by mouth daily.     Greater than 50% of face to face time with patient was spent on counseling and coordination of care. We discussed Patient was stable on 900 mg of Equetro for an extended period of time.  However she eventually started developing side effects of dizziness and nausea and vomiting and could no longer tolerate that dosage.  The dosage was cut back to 600 mg and she has subsequently developed a mixture of obsessive-compulsive and irritable hypomanic symptoms.   She was having unusual intrusive  obsessive thoughts which were about lice and then about Covid.  She had been successful at transitioning from [facility] to a mixture of immediate release and extended release carbamazepine.  She was tolerating the 900 mg daily which had markedly reduced the intrusive obsessive thoughts about Covid and other obsessive anxious thoughts.  It had helped to reduce the dysphoric mixed mood symptoms.    Sx are now well controlled  except PMDD  Patient's insurance would not pay for Tegretol XR 100 mg tablets 3 in the morning and 4 at night because of quantity limits.  Therefore the prescription was rewritten Tegretol-XR 100 mg tablets 1 in the morning #30 and 1 refill and Tegretol-XR 200 mg tablets 1 in the morning and 2 at night #90 and 1 refill.  she is also to take short acting carbamazepine 100 mg tablets 2 at night.  Continue lamotrigine 100 mg at noon and 200 mg at night for bipolar depression  Continue the Wellbutrin SR 150 mg in the morning as she feels that is helpful for energy and appetite control.  And somewhat for attention and focus.  Continue alternating Xanax and Ambien at night but is also taking it prn for anxiety.  I have okayed number 45/month  Sertraline 50 mg prn PMDD.  She counts days and knows when has sx 14 days per month. May need higher dose DT CBZ  She is taking an unusual mix between long-acting and short acting carbamazepine in order to achieve maximum mood benefit but avoid side effects of nausea and dizziness without this combination and also dictated by insurance limitations.  Disc immune response and depression probably explains recent increased depression.  Misses some of the lamotrigine.  We discussed the short-term risks associated with benzodiazepines including sedation and increased fall risk among others.  Discussed long-term side effect risk including dependence, potential withdrawal symptoms, and the potential eventual dose-related  risk of dementia.  But recent studies from 2020 dispute this association between benzodiazepines and dementia risk. Newer studies in 2020 do not support an association with dementia.  This appt was 30 mins.  FU 8 weeks  Lynder Parents, MD, DFAPA   Please see After Visit Summary for patient specific instructions.   Future Appointments  Date Time Provider [place]  03/22/2021  8:20 AM Laurence Ferrari, [place], MD [facility] None  02/21/2022  9:00 AM Rubie Maid, MD [facility] None    No orders of the defined types were placed in this encounter.      -------------------------------

## 2021-03-14 ENCOUNTER — Other Ambulatory Visit: Payer: Self-pay | Admitting: Psychiatry

## 2021-03-14 DIAGNOSIS — F5105 Insomnia due to other mental disorder: Secondary | ICD-10-CM

## 2021-03-16 ENCOUNTER — Ambulatory Visit: Payer: 59 | Admitting: Dermatology

## 2021-03-22 ENCOUNTER — Ambulatory Visit: Payer: 59 | Admitting: Dermatology

## 2021-03-22 ENCOUNTER — Encounter: Payer: Self-pay | Admitting: Dermatology

## 2021-03-22 ENCOUNTER — Other Ambulatory Visit: Payer: Self-pay

## 2021-03-22 DIAGNOSIS — L821 Other seborrheic keratosis: Secondary | ICD-10-CM | POA: Diagnosis not present

## 2021-03-22 DIAGNOSIS — L578 Other skin changes due to chronic exposure to nonionizing radiation: Secondary | ICD-10-CM | POA: Diagnosis not present

## 2021-03-22 DIAGNOSIS — L814 Other melanin hyperpigmentation: Secondary | ICD-10-CM

## 2021-03-22 DIAGNOSIS — Z1283 Encounter for screening for malignant neoplasm of skin: Secondary | ICD-10-CM | POA: Diagnosis not present

## 2021-03-22 DIAGNOSIS — D229 Melanocytic nevi, unspecified: Secondary | ICD-10-CM

## 2021-03-22 DIAGNOSIS — D1800 Hemangioma unspecified site: Secondary | ICD-10-CM

## 2021-03-22 NOTE — Progress Notes (Signed)
Follow-Up Visit   Subjective  Robin Daniel is a 46 y.o. female who presents for the following: Annual Exam (Here for skin cancer screening. Full body. Hx of possible MM at right back. 2021 ).  The patient presents for Total-Body Skin Exam (TBSE) for skin cancer screening and mole check.  The patient has spots, moles and lesions to be evaluated, some may be new or changing and the patient has concerns that these could be cancer.   The following portions of the chart were reviewed this encounter and updated as appropriate:  Tobacco   Allergies   Meds   Problems   Med Hx   Surg Hx   Fam Hx       Review of Systems: No other skin or systemic complaints except as noted in HPI or Assessment and Plan.   Objective  Well appearing patient in no apparent distress; mood and affect are within normal limits.  A full examination was performed including scalp, head, eyes, ears, nose, lips, neck, chest, axillae, abdomen, back, buttocks, bilateral upper extremities, bilateral lower extremities, hands, feet, fingers, toes, fingernails, and toenails. All findings within normal limits unless otherwise noted below.   Assessment & Plan   Lentigines - Scattered tan macules - Due to sun exposure - Benign-appearing, observe - Recommend daily broad spectrum sunscreen SPF 30+ to sun-exposed areas, reapply every 2 hours as needed. - Call for any changes  Seborrheic Keratoses - Stuck-on, waxy, tan-brown papules and/or plaques  - Benign-appearing - Discussed benign etiology and prognosis. - Observe - Call for any changes  Melanocytic Nevi - Tan-brown and/or pink-flesh-colored symmetric macules and papules - Benign appearing on exam today - Observation - Call clinic for new or changing moles - Recommend daily use of broad spectrum spf 30+ sunscreen to sun-exposed areas.   Hemangiomas - Red papules - Discussed benign nature - Observe - Call for any changes  Actinic Damage - Chronic  condition, secondary to cumulative UV/sun exposure - diffuse scaly erythematous macules with underlying dyspigmentation - Recommend daily broad spectrum sunscreen SPF 30+ to sun-exposed areas, reapply every 2 hours as needed.  - Staying in the shade or wearing long sleeves, sun glasses (UVA+UVB protection) and wide brim hats (4-inch brim around the entire circumference of the hat) are also recommended for sun protection.  - Call for new or changing lesions.  History of possible melanoma at back 2021 (Breslow's depth 1.4 mm, mitotic index 6/mm2) - No evidence of recurrence today - No lymphadenopathy - Recommend regular full body skin exams - Recommend daily broad spectrum sunscreen SPF 30+ to sun-exposed areas, reapply every 2 hours as needed.  - Call if any new or changing lesions are noted between office visits - After definitive treatment, we recommend total-body skin exams every 3 months for a year; then every 4 months for a year; then every 6 months for 3 years.  At 5 years post treatment, if all looks good we would recommend at least yearly total-body skin exams for the rest of your life.  The patient was given time for questions and these were answered.  We recommend frequent self skin examinations; photoprotection with sunscreen, sun protective clothing, hats, sunglasses and sun avoidance.  If the patient notices any new or changing skin lesions the patient should return to the office immediately for evaluation.    Skin cancer screening performed today.   Return in about 6 months (around 09/19/2021) for TBSE. Pt prefers 6 months over 4 month FBSE  and will watch skin at home.  I, Emelia Salisbury, CMA, am acting as scribe for Forest Gleason, MD.  Documentation: I have reviewed the above documentation for accuracy and completeness, and I agree with the above.  Forest Gleason, MD

## 2021-03-22 NOTE — Patient Instructions (Addendum)
Recommend taking Heliocare sun protection supplement daily in sunny weather for additional sun protection. For maximum protection on the sunniest days, you can take up to 2 capsules of regular Heliocare OR take 1 capsule of Heliocare Ultra. For prolonged exposure (such as a full day in the sun), you can repeat your dose of the supplement 4 hours after your first dose. Heliocare can be purchased at [employer], at some [facility] or at [email or web address].     Recommend taking Vitamin D 800 iu daily.     Recommend daily broad spectrum sunscreen SPF 30+ to sun-exposed areas, reapply every 2 hours as needed. Call for new or changing lesions.  Staying in the shade or wearing long sleeves, sun glasses (UVA+UVB protection) and wide brim hats (4-inch brim around the entire circumference of the hat) are also recommended for sun protection.    Melanoma ABCDEs  Melanoma is the most dangerous type of skin cancer, and is the leading cause of death from skin disease.  You are more likely to develop melanoma if you: Have light-colored skin, light-colored eyes, or red or blond hair Spend a lot of time in the sun Tan regularly, either outdoors or in a tanning bed Have had blistering sunburns, especially during childhood Have a close family member who has had a melanoma Have atypical moles or large birthmarks  Early detection of melanoma is key since treatment is typically straightforward and cure rates are extremely high if we catch it early.   The first sign of melanoma is often a change in a mole or a new dark spot.  The ABCDE system is a way of remembering the signs of melanoma.  A for asymmetry:  The two halves do not match. B for border:  The edges of the growth are irregular. C for color:  A mixture of colors are present instead of an even brown color. D for diameter:  Melanomas are usually (but not always) greater than 55m - the size of a pencil eraser. E for evolution:  The spot keeps  changing in size, shape, and color.  Please check your skin once per month between visits. You can use a small mirror in front and a large mirror behind you to keep an eye on the back side or your body.   If you see any new or changing lesions before your next follow-up, please call to schedule a visit.  Please continue daily skin protection including broad spectrum sunscreen SPF 30+ to sun-exposed areas, reapplying every 2 hours as needed when you're outdoors.   Staying in the shade or wearing long sleeves, sun glasses (UVA+UVB protection) and wide brim hats (4-inch brim around the entire circumference of the hat) are also recommended for sun protection.     If You Need Anything After Your Visit  If you have any questions or concerns for your doctor, please call our main line at [phone] press option 4 to reach your doctor's medical assistant. If no one answers, please leave a voicemail as directed and we will return your call as soon as possible. Messages left after 4 pm will be answered the following business day.   You may also send [place] message via [facility] We typically respond to MyChart messages within 1-2 business days.  For prescription refills, please ask your pharmacy to contact our office. Our fax number is [phone]  If you have an urgent issue when the clinic is closed that cannot wait until the next business day, you  can page your doctor at the number below.    Please note that while we do our best to be available for urgent issues outside of office hours, we are not available 24/7.   If you have an urgent issue and are unable to reach [place], you may choose to seek medical care at your doctor's office, retail clinic, urgent care center, or emergency room.  If you have a medical emergency, please immediately call 911 or go to the emergency department.  Pager Numbers  - Dr. Nehemiah Massed: [phone]  - Dr. Laurence Ferrari: [phone]  - Dr. Nicole Kindred: [phone]  In the  event of inclement weather, please call our main line at [phone] for an update on the status of any delays or closures.  Dermatology Medication Tips: Please keep the boxes that topical medications come in in order to help keep track of the instructions about where and how to use these. Pharmacies typically print the medication instructions only on the boxes and not directly on the medication tubes.   If your medication is too expensive, please contact our office at [phone] option 4 or send [place] a message through [place].   We are unable to tell what your co-pay for medications will be in advance as this is different depending on your insurance coverage. However, we may be able to find a substitute medication at lower cost or fill out paperwork to get insurance to cover a needed medication.   If a prior authorization is required to get your medication covered by your insurance company, please allow [place] 1-2 business days to complete this process.  Drug prices often vary depending on where the prescription is filled and some pharmacies may offer cheaper prices.  The website www.goodrx.com contains coupons for medications through different pharmacies. The prices here do not account for what the cost may be with help from insurance (it may be cheaper with your insurance), but the website can give you the price if you did not use any insurance.  - You can print the associated coupon and take it with your prescription to the pharmacy.  - You may also stop by our office during regular business hours and pick up a GoodRx coupon card.  - If you need your prescription sent electronically to a different pharmacy, notify our office through [facility] or by phone at [phone] option 4.     Si Usted Necesita Algo Despus de Su Visita  Tambin puede enviarnos un mensaje a travs de Pharmacist, community. Por lo general respondemos a los mensajes de MyChart en el transcurso de 1 a 2 das hbiles.  Para  renovar recetas, por favor pida a su farmacia que se ponga en contacto con nuestra oficina. Harland Dingwall de fax es [place] [phone].  Si tiene un asunto urgente cuando la clnica est cerrada y que no puede esperar hasta el siguiente da hbil, puede llamar/localizar a su doctor(a) al nmero que aparece a continuacin.   Por favor, tenga en cuenta que aunque hacemos todo lo posible para estar disponibles para asuntos urgentes fuera del horario de [place], no estamos disponibles las 24 horas del da, los 7 das de la [place].   Si tiene un problema urgente y no puede comunicarse con nosotros, puede optar por buscar atencin mdica  en el consultorio de su doctor(a), en una clnica privada, en un centro de atencin urgente o en una sala de emergencias.  Si tiene Engineering geologist, por favor llame inmediatamente al 911 o vaya a la  sala de emergencias.  Nmeros de bper  - Dr. Nehemiah Massed: [phone]  - Dra. Moye: [phone]  - Dra. Nicole Kindred: [phone]  En caso de inclemencias del [place], por favor llame a Johnsie Kindred principal al [phone] para una actualizacin sobre el [place] de cualquier retraso o cierre.  Consejos para la medicacin en dermatologa: Por favor, guarde las cajas en las que vienen los medicamentos de uso tpico para ayudarle a seguir las instrucciones sobre dnde y cmo usarlos. Las farmacias generalmente imprimen las instrucciones del medicamento slo en las cajas y no directamente en los tubos del [place].   Si su medicamento es muy caro, por favor, pngase en contacto con Zigmund Daniel llamando al [phone] y presione la opcin 4 o envenos un mensaje a travs de Pharmacist, community.   No podemos decirle cul ser su copago por los medicamentos por adelantado ya que esto es diferente dependiendo de la cobertura de su seguro. Sin embargo, es posible que podamos encontrar un medicamento sustituto a Electrical engineer un formulario para que el seguro cubra el  medicamento que se considera necesario.   Si se requiere una autorizacin previa para que su compaa de seguros [place] su medicamento, por favor permtanos de 1 a 2 das hbiles para completar este proceso.  Los precios de los medicamentos varan con frecuencia dependiendo del Environmental consultant de dnde se surte la receta y alguna farmacias pueden ofrecer precios ms baratos.  El sitio web www.goodrx.com tiene cupones para medicamentos de Airline pilot. Los precios aqu no tienen en cuenta lo que podra costar con la ayuda del seguro (puede ser ms barato con su seguro), pero el sitio web puede darle el precio si no utiliz Research scientist (physical sciences).  - Puede imprimir el cupn correspondiente y llevarlo con su receta a la farmacia.  - Tambin puede pasar por nuestra oficina durante el horario de atencin regular y Charity fundraiser una tarjeta de cupones de GoodRx.  - Si necesita que su receta se enve electrnicamente a una farmacia diferente, informe a nuestra oficina a travs de MyChart de [place] o por telfono llamando al [phone] y presione la opcin 4.

## 2021-03-27 ENCOUNTER — Other Ambulatory Visit: Payer: Self-pay | Admitting: Psychiatry

## 2021-03-27 DIAGNOSIS — F3281 Premenstrual dysphoric disorder: Secondary | ICD-10-CM

## 2021-04-04 ENCOUNTER — Other Ambulatory Visit: Payer: Self-pay | Admitting: Psychiatry

## 2021-04-04 DIAGNOSIS — F3181 Bipolar II disorder: Secondary | ICD-10-CM

## 2021-04-05 ENCOUNTER — Other Ambulatory Visit: Payer: Self-pay | Admitting: Psychiatry

## 2021-04-05 DIAGNOSIS — F3181 Bipolar II disorder: Secondary | ICD-10-CM

## 2021-04-11 ENCOUNTER — Other Ambulatory Visit: Payer: Self-pay | Admitting: Psychiatry

## 2021-04-11 DIAGNOSIS — F3181 Bipolar II disorder: Secondary | ICD-10-CM

## 2021-04-13 ENCOUNTER — Other Ambulatory Visit: Payer: Self-pay | Admitting: Psychiatry

## 2021-04-13 DIAGNOSIS — F3181 Bipolar II disorder: Secondary | ICD-10-CM

## 2021-04-27 ENCOUNTER — Encounter: Payer: Self-pay | Admitting: Psychiatry

## 2021-04-27 ENCOUNTER — Ambulatory Visit (INDEPENDENT_AMBULATORY_CARE_PROVIDER_SITE_OTHER): Payer: 59 | Admitting: Psychiatry

## 2021-04-27 DIAGNOSIS — F411 Generalized anxiety disorder: Secondary | ICD-10-CM | POA: Diagnosis not present

## 2021-04-27 DIAGNOSIS — F5105 Insomnia due to other mental disorder: Secondary | ICD-10-CM | POA: Diagnosis not present

## 2021-04-27 DIAGNOSIS — F3281 Premenstrual dysphoric disorder: Secondary | ICD-10-CM | POA: Diagnosis not present

## 2021-04-27 DIAGNOSIS — R69 Illness, unspecified: Secondary | ICD-10-CM | POA: Diagnosis not present

## 2021-04-27 DIAGNOSIS — F422 Mixed obsessional thoughts and acts: Secondary | ICD-10-CM

## 2021-04-27 DIAGNOSIS — F3181 Bipolar II disorder: Secondary | ICD-10-CM

## 2021-04-27 MED ORDER — ZOLPIDEM TARTRATE 10 MG PO TABS
5.0000 mg | ORAL_TABLET | Freq: Every evening | ORAL | 3 refills | Status: DC | PRN
Start: 2021-04-27 — End: 2021-08-08

## 2021-04-27 NOTE — Progress Notes (Signed)
Robin Daniel ?[id number] ?01-09-1976 ?46 y.o.  ? ? ?Subjective:  ? ?Patient ID:  Robin Daniel is a 46 y.o. (DOB January 18, 1976) female. ? ?Chief Complaint:  ?Chief Complaint  ?Patient presents with  ? Follow-up  ? Depression  ? Anxiety  ? Sleeping Problem  ? ? ?Depression ?       Associated symptoms include no decreased concentration and no suicidal ideas.  Past medical history includes anxiety.   ?Anxiety ?Patient reports no confusion, decreased concentration, nervous/anxious behavior or suicidal ideas.  ? ? ?Robin Daniel call yesterday for an urgent visit because of problems keeping her meds straight with resultant side effect issues.  Follow-up med changes and mood. ? ?At visit Jun 06, 2018.  Insurance problems not covering Equetro resulted in the patient having to switch to a mixture of Tegretol-XR 100 mg tablets, 200 mg Tegretol-XR tablets, and immediate release to 200 mg carbamazepine at night this is resulted in some patient confusion about how to take her medicines and she has called a couple of times since her last appointment having mixed her medications up and having problems. ? ?seen October 2020.  The following was noted and no meds were changed. ?Overall doing well.  No sig mood swings except PMDD.  Anxiety is manageable.  No major mood swings.  Trouble with getting enough fiber.  Taken Miralax for years.   ?Reduced lamotrigine in August 2020 DT dizziness and the dizziness resolved. ? ?06/12/2019 appointment, the following is noted: ? Occ Ambien.  More often alprazolam 0.5 mg HS. Usually good sleep and rested. 7 hours ?Good except perimenopausal.  Irregular and heavy periods and can cause her to feel pretty crappy. Avoiding replacement of hormones.  Otherwise good mood most of the time.  Not depressed since here but can feel agitated and bloated. ?No GI sx since reducing lamotrigine to current dose. ?Plan no significant med changes. ? ?02/05/2020 appointment with the following  noted: ?Son 69 yo homeschooling since [place]. ?Mood "goodish".  Covid last month.  Recovered well except put me in a slump more depressed.  With Covid had HA and nausea and altered taste.   ?More depressed and ruminating lately too. ?Also stress medical problems.  Wearing me down.  Anxiety around repeated breast issues.      ?Plan: No med changes ? ?07/27/2020 appointment with the following noted: ?Robin Daniel is 46 yo.   ?Alternates between alprazolam and Ambien at night to reduce tolerance.  That seems to work. ?Mood has been good. ?No complaints about meds.  Cycle is less than in past but only once every few mos.  Still gets some PMS sx.  Irrational thoughts 7 days per month and feels like a failure as mother, wife, irritable.  H notices it. ?Needs new therapist. ?Patient denies difficulty with sleep initiation or maintenance. Still struggles with chronic anxiety and negative thoughts about herself and lately health related.  Denies appetite disturbance.  Patient reports that energy and motivation have been good.  Patient denies any difficulty with concentration.  Patient denies any suicidal ideation. Also is irritalble and anxious with period.   ? ?01/26/2021 appt noted: ?Good for the most part.  Covid early December and prednisone for a month then menstrual c ycle.  Don't feel as well.  Struggling more with fear and death thoughts.  Yesterday really tired.  Appetite on and off.  Mind goes straight to a dark place with worry and spirals. ?Not felt well for a couple of weeks and  hates it.  Worries over it.  Fearful of medical evaluations and sx.  Normal mammogram.  Chronic medical worry.  How am I going to handle it if something happens for real. ? ?03/04/2021 appt noted:  with H ?This week seemed to need the sertraline longer.  Become OC about health things.  Started getting  compulsive seeing doctor.  7 days/month was not enough.  Exaggerated somatic fears.  Couldn't reason and was freaking out.  Does see benefit with  sertraline markedly.   ?H sees it as OC.  ?Seems ok on the other days but the bad days are intense. ?No mania.   ? ?04/27/2021 appointment with the following noted: ?Ambien works if rotates with Xanax for sleep.  Occ Xanax for anxiety. ?Today feels good but crappy the last week. ?Hormonally related mood problems and sees benefit with sertraline usually.  When it happens gets unrealistic health obsessions and more depressed and hopeless but it passes.   ?Doesn't take more than 14 sertraline in a month but worries about taking it too much. Irregular cycles.  Sometimes doesn't feel in control of emotions.  Hard getting older and having trouble accepting this.   ?Never taking hormones. ?Worries about memory. ? ?Past Psychiatric Medication Trials: Carbamazepine 900, lamotrigine XR 200 mg twice daily, Wellbutrin SR 100 mg twice daily,  ?naltrexone, Abilify,  ?Trintellix, paroxetine weakness,  sertraline, other antidepressants ?stimulants caused mania,  ?alprazolam, Ambien,  ?N-acetylcysteine,  ?topiramate,  ? ?She also has had a good response to the full dose of Equetro. ? ?Review of Systems: No tremor no weakness no nausea vomiting. No dizziness. Fatigue lately. ?Definite residual anxiety ? ?Medications: I have reviewed the patient's current medications. ? ?Current Outpatient Medications  ?Medication Sig Dispense Refill  ? ALPRAZolam (XANAX) 1 MG tablet TAKE 1 TABLET BY MOUTH 2 TIMES DAILY AS NEEDED FOR ANXIETY. 45 tablet 1  ? bimatoprost (LATISSE) 0.03 % ophthalmic solution PLACE 1 DROP ON APPLICATOR & APPLY EVENLY AT THE BASE OF EYELASHES NIGHTLY ON BOTH EYES.    ? buPROPion (WELLBUTRIN SR) 150 MG 12 hr tablet TAKE 1 TABLET BY MOUTH EVERY DAY 90 tablet 1  ? carbamazepine (TEGRETOL XR) 100 MG 12 hr tablet TAKE 1 TABLET (100 MG TOTAL) BY MOUTH IN THE MORNING 90 tablet 0  ? carbamazepine (TEGRETOL XR) 200 MG 12 hr tablet TAKE 1 TABLET EACH MORNING AND 2 TABLETS EACH NIGHT. 270 tablet 0  ? carbamazepine (TEGRETOL) 100 MG  chewable tablet CHEW 2 TABLETS (200 MG TOTAL) BY MOUTH AT BEDTIME. 180 tablet 0  ? HYDROcodone-acetaminophen (NORCO/VICODIN) 5-325 MG tablet Take 1 tablet by mouth every 6 (six) hours as needed for moderate pain. 15 tablet 0  ? hydrocortisone 2.5 % cream Apply topically 2 (two) times daily as needed (Rash). Use for up to 1 week. Apply to eyelids for rash 3.5 g 0  ? lamoTRIgine (LAMICTAL) 200 MG tablet TAKE 1/2 TABLET BY MOUTH AT LUNCH AND 1 TABLET BY MOUTH AT NIGHT 135 tablet 1  ? levothyroxine (SYNTHROID, LEVOTHROID) 50 MCG tablet Take 1 tablet by mouth every morning.  5  ? sertraline (ZOLOFT) 50 MG tablet TAKE 1 TABLET BY MOUTH EVERY DAY (Patient taking differently: Take 50 mg by mouth daily as needed.) 90 tablet 1  ? zolpidem (AMBIEN) 10 MG tablet Take 0.5-1 tablets (5-10 mg total) by mouth at bedtime as needed for sleep. 30 tablet 3  ? ?No current facility-administered medications for this visit.  ? ? ?Medication Side Effects: None  no longer dizziness, nausea ? ?Allergies: No Known Allergies ? ?Past Medical History:  ?Diagnosis Date  ? Bipolar disorder ([place])   ? Hypothyroidism   ? ? ?Family History  ?Problem Relation Age of Onset  ? Anxiety disorder Father   ? Breast cancer Neg Hx   ? ? ?Social History  ? ?Socioeconomic History  ? Marital status: Married  ?  Spouse name: Not on file  ? Number of children: Not on file  ? Years of education: Not on file  ? Highest education level: Not on file  ?Occupational History  ? Not on file  ?Tobacco Use  ? Smoking status: Never  ? Smokeless tobacco: Never  ?Substance and Sexual Activity  ? Alcohol use: No  ? Drug use: No  ? Sexual activity: Not on file  ?Other Topics Concern  ? Not on file  ?Social History Narrative  ? Not on file  ? ?Social Determinants of Health  ? ?Financial Resource Strain: Not on file  ?Food Insecurity: Not on file  ?Transportation Needs: Not on file  ?Physical Activity: Not on file  ?Stress: Not on file  ?Social Connections: Not on file  ?Intimate  Partner Violence: Not on file  ? ? ?Past Medical History, Surgical history, Social history, and Family history were reviewed and updated as appropriate.  ? ?Please see review of systems for further details on the

## 2021-06-13 ENCOUNTER — Telehealth: Payer: Self-pay | Admitting: Psychiatry

## 2021-06-13 ENCOUNTER — Other Ambulatory Visit: Payer: Self-pay

## 2021-06-13 DIAGNOSIS — F411 Generalized anxiety disorder: Secondary | ICD-10-CM

## 2021-06-13 MED ORDER — ALPRAZOLAM 1 MG PO TABS: 1.0000 mg | ORAL_TABLET | Freq: Two times a day (BID) | ORAL | 1 refills | Status: DC | PRN

## 2021-06-13 NOTE — Telephone Encounter (Signed)
Robin Daniel called this morning at 9:35am to request refill of her Xanax.  She can't get through to her pharmacy and she wants to change pharmacies anyway.  Please send refill to the [facility] on [address] in [place], [place]  appt 7/17

## 2021-06-13 NOTE — Telephone Encounter (Signed)
Pended.

## 2021-06-27 ENCOUNTER — Telehealth: Payer: Self-pay | Admitting: Psychiatry

## 2021-06-28 NOTE — Telephone Encounter (Signed)
error 

## 2021-07-24 ENCOUNTER — Other Ambulatory Visit: Payer: Self-pay | Admitting: Psychiatry

## 2021-07-24 DIAGNOSIS — F3181 Bipolar II disorder: Secondary | ICD-10-CM

## 2021-07-25 ENCOUNTER — Other Ambulatory Visit: Payer: Self-pay | Admitting: Psychiatry

## 2021-07-25 DIAGNOSIS — F3181 Bipolar II disorder: Secondary | ICD-10-CM

## 2021-07-27 ENCOUNTER — Telehealth: Payer: Self-pay | Admitting: Psychiatry

## 2021-07-27 DIAGNOSIS — F3181 Bipolar II disorder: Secondary | ICD-10-CM

## 2021-07-27 MED ORDER — CARBAMAZEPINE ER 200 MG PO TB12: ORAL_TABLET | ORAL | 1 refills | Status: DC

## 2021-07-27 MED ORDER — CARBAMAZEPINE 100 MG PO CHEW: 200.0000 mg | CHEWABLE_TABLET | Freq: Every day | ORAL | 1 refills | Status: DC

## 2021-07-27 NOTE — Telephone Encounter (Signed)
Britanee called today at 4:56 to report that she is having a hard time getting her carbamazepine XR '200mg'$  and the '100mg'$  chewable tablet.  The pharmacy told her insurance wouldn't pay for but I don't see that they have a prescription for them.  She got the '100mg'$  XR but the pharmacy won't fill the other two. Please send in new prescriptions.  She is out as of today.  Appt 7/17.  Send to [facility] on [address]

## 2021-07-27 NOTE — Telephone Encounter (Signed)
Rx sent 

## 2021-07-27 NOTE — Telephone Encounter (Signed)
After talking with the patient she thinks the issue may be that there is no RF on file for the 200 XR and the 100 chewable.  A new Rx for 100 mg XR was sent on 7/3. I told her I would send in Rx for the other two doses to [facility] before I left for the day.

## 2021-08-08 ENCOUNTER — Ambulatory Visit: Payer: 59 | Admitting: Psychiatry

## 2021-08-08 ENCOUNTER — Encounter: Payer: Self-pay | Admitting: Psychiatry

## 2021-08-08 DIAGNOSIS — F422 Mixed obsessional thoughts and acts: Secondary | ICD-10-CM | POA: Diagnosis not present

## 2021-08-08 DIAGNOSIS — F411 Generalized anxiety disorder: Secondary | ICD-10-CM | POA: Diagnosis not present

## 2021-08-08 DIAGNOSIS — F5105 Insomnia due to other mental disorder: Secondary | ICD-10-CM

## 2021-08-08 DIAGNOSIS — R69 Illness, unspecified: Secondary | ICD-10-CM | POA: Diagnosis not present

## 2021-08-08 DIAGNOSIS — F3181 Bipolar II disorder: Secondary | ICD-10-CM

## 2021-08-08 DIAGNOSIS — F3281 Premenstrual dysphoric disorder: Secondary | ICD-10-CM

## 2021-08-08 MED ORDER — BUPROPION HCL ER (SR) 150 MG PO TB12: 150.0000 mg | ORAL_TABLET | Freq: Every day | ORAL | 1 refills | Status: DC

## 2021-08-08 MED ORDER — CARBAMAZEPINE ER 200 MG PO TB12: ORAL_TABLET | ORAL | 1 refills | Status: DC

## 2021-08-08 MED ORDER — LAMOTRIGINE 200 MG PO TABS: ORAL_TABLET | ORAL | 1 refills | Status: DC

## 2021-08-08 MED ORDER — ALPRAZOLAM 1 MG PO TABS: 1.0000 mg | ORAL_TABLET | Freq: Two times a day (BID) | ORAL | 1 refills | Status: DC | PRN

## 2021-08-08 MED ORDER — ZOLPIDEM TARTRATE 10 MG PO TABS: 5.0000 mg | ORAL_TABLET | Freq: Every evening | ORAL | 3 refills | Status: DC | PRN

## 2021-08-08 MED ORDER — CARBAMAZEPINE ER 100 MG PO TB12: 100.0000 mg | ORAL_TABLET | Freq: Every morning | ORAL | 1 refills | Status: DC

## 2021-08-08 MED ORDER — CARBAMAZEPINE 100 MG PO CHEW: 200.0000 mg | CHEWABLE_TABLET | Freq: Every day | ORAL | 1 refills | Status: DC

## 2021-08-08 NOTE — Progress Notes (Signed)
Robin Daniel [id number] 02-28-1975 46 y.o.    Subjective:   Patient ID:  Robin Daniel is a 46 y.o. (DOB July 14, 1975) female.  Chief Complaint:  Chief Complaint  Patient presents with   Follow-up    Bipolar II disorder ([place])   Anxiety    Depression        Associated symptoms include no decreased concentration and no suicidal ideas.  Past medical history includes anxiety.   Anxiety Patient reports no confusion, decreased concentration, nervous/anxious behavior or suicidal ideas.     Robin Daniel call yesterday for an urgent visit because of problems keeping her meds straight with resultant side effect issues.  Follow-up med changes and mood.  At visit Jun 06, 2018.  Insurance problems not covering Equetro resulted in the patient having to switch to a mixture of Tegretol-XR 100 mg tablets, 200 mg Tegretol-XR tablets, and immediate release to 200 mg carbamazepine at night this is resulted in some patient confusion about how to take her medicines and she has called a couple of times since her last appointment having mixed her medications up and having problems.  seen October 2020.  The following was noted and no meds were changed. Overall doing well.  No sig mood swings except PMDD.  Anxiety is manageable.  No major mood swings.  Trouble with getting enough fiber.  Taken Miralax for years.   Reduced lamotrigine in August 2020 DT dizziness and the dizziness resolved.  06/12/2019 appointment, the following is noted:  Occ Ambien.  More often alprazolam 0.5 mg HS. Usually good sleep and rested. 7 hours Good except perimenopausal.  Irregular and heavy periods and can cause her to feel pretty crappy. Avoiding replacement of hormones.  Otherwise good mood most of the time.  Not depressed since here but can feel agitated and bloated. No GI sx since reducing lamotrigine to current dose. Plan no significant med changes.  02/05/2020 appointment with the following  noted: Son 36 yo homeschooling since Robin Daniel. Mood "goodish".  Covid last month.  Recovered well except put me in a slump more depressed.  With Covid had HA and nausea and altered taste.   More depressed and ruminating lately too. Also stress medical problems.  Wearing me down.  Anxiety around repeated breast issues.      Plan: No med changes  07/27/2020 appointment with the following noted: Robin Daniel is 46 yo.   Alternates between alprazolam and Ambien at night to reduce tolerance.  That seems to work. Mood has been good. No complaints about meds.  Cycle is less than in past but only once every few mos.  Still gets some PMS sx.  Irrational thoughts 7 days per month and feels like a failure as mother, wife, irritable.  H notices it. Needs new therapist. Patient denies difficulty with sleep initiation or maintenance. Still struggles with chronic anxiety and negative thoughts about herself and lately health related.  Denies appetite disturbance.  Patient reports that energy and motivation have been good.  Patient denies any difficulty with concentration.  Patient denies any suicidal ideation. Also is irritalble and anxious with period.    01/26/2021 appt noted: Good for the most part.  Covid early December and prednisone for a month then menstrual c ycle.  Don't feel as well.  Struggling more with fear and death thoughts.  Yesterday really tired.  Appetite on and off.  Mind goes straight to a dark place with worry and spirals. Not felt well for a couple of weeks  and hates it.  Worries over it.  Fearful of medical evaluations and sx.  Normal mammogram.  Chronic medical worry.  How am I going to handle it if something happens for real.  03/04/2021 appt noted:  with H This week seemed to need the sertraline longer.  Become OC about health things.  Started getting  compulsive seeing doctor.  7 days/month was not enough.  Exaggerated somatic fears.  Couldn't reason and was freaking out.  Does see benefit with  sertraline markedly.   H sees it as OC.  Seems ok on the other days but the bad days are intense. No mania.    04/27/2021 appointment with the following noted: Ambien works if rotates with Xanax for sleep.  Occ Xanax for anxiety. Today feels good but crappy the last week. Hormonally related mood problems and sees benefit with sdertraline usually.  When it happens gets unrealistic health obsessions and more depressed and hopeless but it passes.   Doesn't take more than 14 sertraline in a month but worries about taking it too much. Irregular cycles.  Sometimes doesn't feel in control of emotions.  Hard getting older and having trouble accepting this.   Never taking hormones. Worries about memory.  08/08/21 appt noted: Mood pretty steady.  Sertraline really helped a lot prn and working out better than scheduled. Pleased with meds. About 6 weeks ago decided to eat more and specifically more protein and weight trained for 6 weeks and feels better doing so. Taiking in from 45 g protein to over 100 G protein and feels better. Ok with sleep generally. Robin Daniel 46 yo wants to RT school instead of home school and going to private school. Going to school church.   Past Psychiatric Medication Trials: Carbamazepine 900, lamotrigine XR 200 mg twice daily, Wellbutrin SR 100 mg twice daily,  naltrexone, Abilify,  Trintellix, paroxetine weakness,  sertraline, other antidepressants stimulants caused mania,  alprazolam, Ambien,  N-acetylcysteine,  topiramate,   She also has had a good response to the full dose of Equetro.  Review of Systems: No tremor no weakness no nausea vomiting. No dizziness. Less Fatigue lately. Definite residual anxiety  Medications: I have reviewed the patient's current medications.  Current Outpatient Medications  Medication Sig Dispense Refill   HYDROcodone-acetaminophen (NORCO/VICODIN) 5-325 MG tablet Take 1 tablet by mouth every 6 (six) hours as needed for moderate pain. 15  tablet 0   hydrocortisone 2.5 % cream Apply topically 2 (two) times daily as needed (Rash). Use for up to 1 week. Apply to eyelids for rash 3.5 g 0   levothyroxine (SYNTHROID, LEVOTHROID) 50 MCG tablet Take 1 tablet by mouth every morning.  5   sertraline (ZOLOFT) 50 MG tablet TAKE 1 TABLET BY MOUTH EVERY DAY (Patient taking differently: Take 50 mg by mouth daily as needed.) 90 tablet 1   ALPRAZolam (XANAX) 1 MG tablet Take 1 tablet (1 mg total) by mouth 2 (two) times daily as needed for anxiety. 45 tablet 1   bimatoprost (LATISSE) 0.03 % ophthalmic solution PLACE 1 DROP ON APPLICATOR & APPLY EVENLY AT THE BASE OF EYELASHES NIGHTLY ON BOTH EYES.     buPROPion (WELLBUTRIN SR) 150 MG 12 hr tablet Take 1 tablet (150 mg total) by mouth daily. 90 tablet 1   carbamazepine (TEGRETOL XR) 100 MG 12 hr tablet Take 1 tablet (100 mg total) by mouth every morning. 90 tablet 1   carbamazepine (TEGRETOL XR) 200 MG 12 hr tablet TAKE 1 TABLET EACH MORNING AND  2 TABLETS EACH NIGHT. 90 tablet 1   carbamazepine (TEGRETOL) 100 MG chewable tablet Chew 2 tablets (200 mg total) by mouth at bedtime. 180 tablet 1   lamoTRIgine (LAMICTAL) 200 MG tablet TAKE 1/2 TABLET BY MOUTH AT LUNCH AND 1 TABLET BY MOUTH AT NIGHT 135 tablet 1   zolpidem (AMBIEN) 10 MG tablet Take 0.5-1 tablets (5-10 mg total) by mouth at bedtime as needed for sleep. 30 tablet 3   No current facility-administered medications for this visit.    Medication Side Effects: None no longer dizziness, nausea  Allergies: No Known Allergies  Past Medical History:  Diagnosis Date   Bipolar disorder ([place])    Hypothyroidism     Family History  Problem Relation Age of Onset   Anxiety disorder Father    Breast cancer Neg Hx     Social History   Socioeconomic History   Marital status: Married    Spouse name: Not on file   Number of children: Not on file   Years of education: Not on file   Highest education level: Not on file  Occupational History    Not on file  Tobacco Use   Smoking status: Never   Smokeless tobacco: Never  Substance and Sexual Activity   Alcohol use: No   Drug use: No   Sexual activity: Not on file  Other Topics Concern   Not on file  Social History Narrative   Not on file   Social Determinants of Health   Financial Resource Strain: Not on file  Food Insecurity: Not on file  Transportation Needs: Not on file  Physical Activity: Not on file  Stress: Not on file  Social Connections: Not on file  Intimate Partner Violence: Not on file    Past Medical History, Surgical history, Social history, and Family history were reviewed and updated as appropriate.   Please see review of systems for further details on the patient's review from today.   Objective:   Physical Exam:  There were no vitals taken for this visit.  Physical Exam Constitutional:      General: She is not in acute distress.    Appearance: She is well-developed.  Musculoskeletal:        General: No deformity.  Neurological:     Mental Status: She is alert and oriented to person, place, and time.     Cranial Nerves: No dysarthria.     Coordination: Coordination normal.  Psychiatric:        Attention and Perception: Attention normal. She does not perceive auditory hallucinations.        Mood and Affect: Mood is not anxious or depressed. Affect is not labile, blunt, angry, tearful or inappropriate.        Speech: Speech normal.        Behavior: Behavior normal. Behavior is cooperative.        Thought Content: Thought content normal. Thought content is not paranoid or delusional. Thought content does not include homicidal or suicidal ideation. Thought content does not include suicidal plan.        Cognition and Memory: Cognition and memory normal.        Judgment: Judgment normal.     Comments: Insight good.   Episodic health obsessions Exaggerated medical fears and obsessions less     Lab Review:  No results found for: "NA", "K",  "CL", "CO2", "GLUCOSE", "BUN", "CREATININE", "CALCIUM", "PROT", "ALBUMIN", "AST", "ALT", "ALKPHOS", "BILITOT", "GFRNONAA", "GFRAA"     Component Value Date/Time  WBC 16.2 (H) 10/29/2007 0505   RBC 2.70 (L) 10/29/2007 0505   HGB 8.7 DELTA CHECK NOTED (L) 10/29/2007 0505   HCT 25.9 (L) 10/29/2007 0505   PLT 183 10/29/2007 0505   MCV 95.8 10/29/2007 0505   MCHC 33.6 10/29/2007 0505   RDW 14.8 10/29/2007 0505    No results found for: "POCLITH", "LITHIUM"   Lab Results  Component Value Date   CBMZ 11.1 11/09/2017     .res Assessment: Plan:    Irja was seen today for follow-up and anxiety.  Diagnoses and all orders for this visit:  Bipolar II disorder ([place]) -     buPROPion (WELLBUTRIN SR) 150 MG 12 hr tablet; Take 1 tablet (150 mg total) by mouth daily. -     carbamazepine (TEGRETOL XR) 100 MG 12 hr tablet; Take 1 tablet (100 mg total) by mouth every morning. -     carbamazepine (TEGRETOL XR) 200 MG 12 hr tablet; TAKE 1 TABLET EACH MORNING AND 2 TABLETS EACH NIGHT. -     carbamazepine (TEGRETOL) 100 MG chewable tablet; Chew 2 tablets (200 mg total) by mouth at bedtime. -     lamoTRIgine (LAMICTAL) 200 MG tablet; TAKE 1/2 TABLET BY MOUTH AT LUNCH AND 1 TABLET BY MOUTH AT NIGHT  Generalized anxiety disorder -     ALPRAZolam (XANAX) 1 MG tablet; Take 1 tablet (1 mg total) by mouth 2 (two) times daily as needed for anxiety.  PMDD (premenstrual dysphoric disorder)  OCD - safety and infestation focused  Insomnia due to mental condition -     zolpidem (AMBIEN) 10 MG tablet; Take 0.5-1 tablets (5-10 mg total) by mouth at bedtime as needed for sleep.     Greater than 50% of face to face time with patient was spent on counseling and coordination of care. We discussed Patient was stable on 900 mg of Equetro for an extended period of time.  However she eventually started developing side effects of dizziness and nausea and vomiting and could no longer tolerate that dosage.  The dosage  was cut back to 600 mg and she has subsequently developed a mixture of obsessive-compulsive and irritable hypomanic symptoms.  She was having unusual intrusive obsessive thoughts which were about lice and then about Covid.  She had been successful at transitioning from [facility] to a mixture of immediate release and extended release carbamazepine.  She was tolerating the 900 mg daily which had markedly reduced the intrusive obsessive thoughts about Covid and other obsessive anxious thoughts.  It had helped to reduce the dysphoric mixed mood symptoms.    Sx are now well controlled  including PMDD Disc role protein in diet plays in response to meds for mood  Patient's insurance would not pay for Tegretol XR 100 mg tablets 3 in the morning and 4 at night because of quantity limits.   Therefore the prescription was rewritten Tegretol-XR 100 mg tablets 1 in the morning #30 and 1 refill and Tegretol-XR 200 mg tablets 1 in the morning and 2 at night #90 and 1 refill.  she is also to take short acting carbamazepine 100 mg tablets 2 at night.  Continue lamotrigine 100 mg at noon and 200 mg at night for bipolar depression  Continue the Wellbutrin SR 150 mg in the morning as she feels that is helpful for energy and appetite control.  And somewhat for attention and focus.  Continue alternating Xanax and Ambien at night but is also taking it prn for anxiety.  I have okayed number 45/month  Sertraline 50 mg prn PMDD.  Use prn has worked.  She is taking an unusual mix between long-acting and short acting carbamazepine in order to achieve maximum mood benefit but avoid side effects of nausea and dizziness without this combination and also dictated by insurance limitations.  Disc immune response and depression probably explains recent increased depression.  Misses some of the lamotrigine.  We discussed the short-term risks associated with benzodiazepines including sedation and increased fall risk among others.   Discussed long-term side effect risk including dependence, potential withdrawal symptoms, and the potential eventual dose-related risk of dementia.  But recent studies from 2020 dispute this association between benzodiazepines and dementia risk. Newer studies in 2020 do not support an association with dementia.  This appt was 30 mins.  FU 3-4 mos  Lynder Parents, MD, DFAPA   Please see After Visit Summary for patient specific instructions.   Future Appointments  Date Time Provider [place]  10/06/2021  8:40 AM Laurence Ferrari, [place], MD [facility] None  12/12/2021  9:30 AM Cottle, [place]., MD [facility] None  02/21/2022  9:00 AM Rubie Maid, MD [facility] None    No orders of the defined types were placed in this encounter.      -------------------------------

## 2021-09-22 ENCOUNTER — Other Ambulatory Visit: Payer: Self-pay | Admitting: Psychiatry

## 2021-09-22 DIAGNOSIS — F3181 Bipolar II disorder: Secondary | ICD-10-CM

## 2021-10-05 ENCOUNTER — Other Ambulatory Visit: Payer: Self-pay | Admitting: Psychiatry

## 2021-10-05 DIAGNOSIS — F3281 Premenstrual dysphoric disorder: Secondary | ICD-10-CM

## 2021-10-06 ENCOUNTER — Encounter: Payer: Self-pay | Admitting: Dermatology

## 2021-10-06 ENCOUNTER — Ambulatory Visit: Payer: 59 | Admitting: Dermatology

## 2021-10-06 DIAGNOSIS — Z85820 Personal history of malignant melanoma of skin: Secondary | ICD-10-CM

## 2021-10-06 DIAGNOSIS — L821 Other seborrheic keratosis: Secondary | ICD-10-CM

## 2021-10-06 DIAGNOSIS — Z1283 Encounter for screening for malignant neoplasm of skin: Secondary | ICD-10-CM | POA: Diagnosis not present

## 2021-10-06 DIAGNOSIS — D1800 Hemangioma unspecified site: Secondary | ICD-10-CM

## 2021-10-06 DIAGNOSIS — L578 Other skin changes due to chronic exposure to nonionizing radiation: Secondary | ICD-10-CM

## 2021-10-06 DIAGNOSIS — D229 Melanocytic nevi, unspecified: Secondary | ICD-10-CM

## 2021-10-06 DIAGNOSIS — L814 Other melanin hyperpigmentation: Secondary | ICD-10-CM

## 2021-10-06 NOTE — Patient Instructions (Addendum)
Recommend taking Heliocare sun protection supplement daily in sunny weather for additional sun protection. For maximum protection on the sunniest days, you can take up to 2 capsules of regular Heliocare OR take 1 capsule of Heliocare Ultra. For prolonged exposure (such as a full day in the sun), you can repeat your dose of the supplement 4 hours after your first dose. Heliocare can be purchased at [employer], at some [facility] or at [email or web address].    Recommend 600-800iu of Vitamin D daily   Recommend daily broad spectrum sunscreen SPF 30+ to sun-exposed areas, reapply every 2 hours as needed. Call for new or changing lesions.  Staying in the shade or wearing long sleeves, sun glasses (UVA+UVB protection) and wide brim hats (4-inch brim around the entire circumference of the hat) are also recommended for sun protection.   Melanoma ABCDEs  Melanoma is the most dangerous type of skin cancer, and is the leading cause of death from skin disease.  You are more likely to develop melanoma if you: Have light-colored skin, light-colored eyes, or red or blond hair Spend a lot of time in the sun Tan regularly, either outdoors or in a tanning bed Have had blistering sunburns, especially during childhood Have a close family member who has had a melanoma Have atypical moles or large birthmarks  Early detection of melanoma is key since treatment is typically straightforward and cure rates are extremely high if we catch it early.   The first sign of melanoma is often a change in a mole or a new dark spot.  The ABCDE system is a way of remembering the signs of melanoma.  A for asymmetry:  The two halves do not match. B for border:  The edges of the growth are irregular. C for color:  A mixture of colors are present instead of an even brown color. D for diameter:  Melanomas are usually (but not always) greater than 18m - the size of a pencil eraser. E for evolution:  The spot keeps changing in  size, shape, and color.  Please check your skin once per month between visits. You can use a small mirror in front and a large mirror behind you to keep an eye on the back side or your body.   If you see any new or changing lesions before your next follow-up, please call to schedule a visit.  Please continue daily skin protection including broad spectrum sunscreen SPF 30+ to sun-exposed areas, reapplying every 2 hours as needed when you're outdoors.   Staying in the shade or wearing long sleeves, sun glasses (UVA+UVB protection) and wide brim hats (4-inch brim around the entire circumference of the hat) are also recommended for sun protection.    Due to recent changes in healthcare laws, you may see results of your pathology and/or laboratory studies on MyChart before the doctors have had a chance to review them. We understand that in some cases there may be results that are confusing or concerning to you. Please understand that not all results are received at the same time and often the doctors may need to interpret multiple results in order to provide you with the best plan of care or course of treatment. Therefore, we ask that you please give u[place]2 business days to thoroughly review all your results before contacting the office for clarification. Should we see a critical lab result, you will be contacted sooner.   If You Need Anything After Your Visit  If you have any  questions or concerns for your doctor, please call our main line at [phone] and press option 4 to reach your doctor's medical assistant. If no one answers, please leave a voicemail as directed and we will return your call as soon as possible. Messages left after 4 pm will be answered the following business day.   You may also send [place] a message via [place]. We typically respond to MyChart messages within 1-2 business days.  For prescription refills, please ask your pharmacy to contact our office. Our fax number is  [phone].  If you have an urgent issue when the clinic is closed that cannot wait until the next business day, you can page your doctor at the number below.    Please note that while we do our best to be available for urgent issues outside of office hours, we are not available 24/7.   If you have an urgent issue and are unable to reach [place], you may choose to seek medical care at your doctor's office, retail clinic, urgent care center, or emergency room.  If you have a medical emergency, please immediately call 911 or go to the emergency department.  Pager Numbers  - Dr. Nehemiah Massed: [phone]  - Dr. Laurence Ferrari: [phone]  - Dr. Nicole Kindred: [phone]  In the event of inclement weather, please call our main line at [phone] for an update on the status of any delays or closures.  Dermatology Medication Tips: Please keep the boxes that topical medications come in in order to help keep track of the instructions about where and how to use these. Pharmacies typically print the medication instructions only on the boxes and not directly on the medication tubes.   If your medication is too expensive, please contact our office at [phone] option 4 or send [place] a message through [place].   We are unable to tell what your co-pay for medications will be in advance as this is different depending on your insurance coverage. However, we may be able to find a substitute medication at lower cost or fill out paperwork to get insurance to cover a needed medication.   If a prior authorization is required to get your medication covered by your insurance company, please allow [place] 1-2 business days to complete this process.  Drug prices often vary depending on where the prescription is filled and some pharmacies may offer cheaper prices.  The website www.goodrx.com contains coupons for medications through different pharmacies. The prices here do not account for what the cost may be with help from  insurance (it may be cheaper with your insurance), but the website can give you the price if you did not use any insurance.  - You can print the associated coupon and take it with your prescription to the pharmacy.  - You may also stop by our office during regular business hours and pick up a GoodRx coupon card.  - If you need your prescription sent electronically to a different pharmacy, notify our office through [facility] or by phone at [phone] option 4.     Si Usted Necesita Algo Despus de Su Visita  Tambin puede enviarnos un mensaje a travs de Pharmacist, community. Por lo general respondemos a los mensajes de MyChart en el transcurso de 1 a 2 das hbiles.  Para renovar recetas, por favor pida a su farmacia que se ponga en contacto con nuestra oficina. Harland Dingwall de fax es [place] [phone].  Si tiene un asunto urgente cuando la clnica est cerrada y que no  puede esperar hasta el siguiente da hbil, puede llamar/localizar a su doctor(a) al nmero que aparece a continuacin.   Por favor, tenga en cuenta que aunque hacemos todo lo posible para estar disponibles para asuntos urgentes fuera del horario de [place], no estamos disponibles las 24 horas del da, los 7 das de la [place].   Si tiene un problema urgente y no puede comunicarse con nosotros, puede optar por buscar atencin mdica  en el consultorio de su doctor(a), en una clnica privada, en un centro de atencin urgente o en una sala de emergencias.  Si tiene Engineering geologist, por favor llame inmediatamente al 911 o vaya a la sala de emergencias.  Nmeros de bper  - Dr. Nehemiah Massed: [phone]  - Dra. Moye: [phone]  - Dra. Nicole Kindred: [phone]  En caso de inclemencias del [place], por favor llame a Johnsie Kindred principal al [phone] para una actualizacin sobre el [place] de cualquier retraso o cierre.  Consejos para la medicacin en dermatologa: Por favor, guarde las cajas en las que vienen los  medicamentos de uso tpico para ayudarle a seguir las instrucciones sobre dnde y cmo usarlos. Las farmacias generalmente imprimen las instrucciones del medicamento slo en las cajas y no directamente en los tubos del [place].   Si su medicamento es muy caro, por favor, pngase en contacto con Zigmund Daniel llamando al [phone] y presione la opcin 4 o envenos un mensaje a travs de Pharmacist, community.   No podemos decirle cul ser su copago por los medicamentos por adelantado ya que esto es diferente dependiendo de la cobertura de su seguro. Sin embargo, es posible que podamos encontrar un medicamento sustituto a Electrical engineer un formulario para que el seguro cubra el medicamento que se considera necesario.   Si se requiere una autorizacin previa para que su compaa de seguros [place] su medicamento, por favor permtanos de 1 a 2 das hbiles para completar este proceso.  Los precios de los medicamentos varan con frecuencia dependiendo del Environmental consultant de dnde se surte la receta y alguna farmacias pueden ofrecer precios ms baratos.  El sitio web www.goodrx.com tiene cupones para medicamentos de Airline pilot. Los precios aqu no tienen en cuenta lo que podra costar con la ayuda del seguro (puede ser ms barato con su seguro), pero el sitio web puede darle el precio si no utiliz Research scientist (physical sciences).  - Puede imprimir el cupn correspondiente y llevarlo con su receta a la farmacia.  - Tambin puede pasar por nuestra oficina durante el horario de atencin regular y Charity fundraiser una tarjeta de cupones de GoodRx.  - Si necesita que su receta se enve electrnicamente a una farmacia diferente, informe a nuestra oficina a travs de MyChart de [place] o por telfono llamando al [phone] y presione la opcin 4.

## 2021-10-06 NOTE — Progress Notes (Unsigned)
   Follow-Up Visit   Subjective  Robin Daniel is a 46 y.o. female who presents for the following: Annual Exam (Skin cancer screening. Full body. Hx of  likely MM on back 2021).  The patient presents for Total-Body Skin Exam (TBSE) for skin cancer screening and mole check.  The patient has spots, moles and lesions to be evaluated, some may be new or changing and the patient has concerns that these could be cancer.   The following portions of the chart were reviewed this encounter and updated as appropriate:  Tobacco  Allergies  Meds  Problems  Med Hx  Surg Hx  Fam Hx      Review of Systems: No other skin or systemic complaints except as noted in HPI or Assessment and Plan.   Objective  Well appearing patient in no apparent distress; mood and affect are within normal limits.  A full examination was performed including scalp, head, eyes, ears, nose, lips, neck, chest, axillae, abdomen, back, buttocks, bilateral upper extremities, bilateral lower extremities, hands, feet, fingers, toes, fingernails, and toenails. All findings within normal limits unless otherwise noted below.   Assessment & Plan   History of Melanoma. Right back. 2021. Breslow's 1.56m. Clark's level IV. Mitotic index 6/mm2 - No evidence of recurrence today - No lymphadenopathy - Recommend regular full body skin exams - Recommend daily broad spectrum sunscreen SPF 30+ to sun-exposed areas, reapply every 2 hours as needed.  - Call if any new or changing lesions are noted between office visits    Lentigines - Scattered tan macules - Due to sun exposure - Benign-appearing, observe - Recommend daily broad spectrum sunscreen SPF 30+ to sun-exposed areas, reapply every 2 hours as needed. - Call for any changes  Seborrheic Keratoses - Stuck-on, waxy, tan-brown papules and/or plaques  - Benign-appearing - Discussed benign etiology and prognosis. - Observe - Call for any changes  Melanocytic Nevi -  Tan-brown and/or pink-flesh-colored symmetric macules and papules - Benign appearing on exam today - Observation - Call clinic for new or changing moles - Recommend daily use of broad spectrum spf 30+ sunscreen to sun-exposed areas.  - Recommend 600-800iu of Vitamin D daily Hemangiomas - Red papules - Discussed benign nature - Observe - Call for any changes  Actinic Damage - Chronic condition, secondary to cumulative UV/sun exposure - diffuse scaly erythematous macules with underlying dyspigmentation - Recommend daily broad spectrum sunscreen SPF 30+ to sun-exposed areas, reapply every 2 hours as needed.  - Staying in the shade or wearing long sleeves, sun glasses (UVA+UVB protection) and wide brim hats (4-inch brim around the entire circumference of the hat) are also recommended for sun protection.  - Call for new or changing lesions. - Recommend taking Heliocare sun protection supplement daily in sunny weather for additional sun protection. For maximum protection on the sunniest days, you can take up to 2 capsules of regular Heliocare OR take 1 capsule of Heliocare Ultra. For prolonged exposure (such as a full day in the sun), you can repeat your dose of the supplement 4 hours after your first dose.   Skin cancer screening performed today.  Actinic skin damage   Return in about 6 months (around 04/06/2022) for TBSE, HxMM.  .Loraine Maple CMA, am acting as scribe for VForest Gleason MD.  Documentation: I have reviewed the above documentation for accuracy and completeness, and I agree with the above.  VForest Gleason MD

## 2021-10-07 ENCOUNTER — Encounter: Payer: Self-pay | Admitting: Dermatology

## 2021-10-19 DIAGNOSIS — E7800 Pure hypercholesterolemia, unspecified: Secondary | ICD-10-CM | POA: Diagnosis not present

## 2021-10-19 DIAGNOSIS — R69 Illness, unspecified: Secondary | ICD-10-CM | POA: Diagnosis not present

## 2021-10-19 DIAGNOSIS — R3129 Other microscopic hematuria: Secondary | ICD-10-CM | POA: Diagnosis not present

## 2021-10-24 ENCOUNTER — Other Ambulatory Visit: Payer: Self-pay | Admitting: Psychiatry

## 2021-10-24 DIAGNOSIS — F3181 Bipolar II disorder: Secondary | ICD-10-CM

## 2021-10-26 DIAGNOSIS — R69 Illness, unspecified: Secondary | ICD-10-CM | POA: Diagnosis not present

## 2021-10-26 DIAGNOSIS — E039 Hypothyroidism, unspecified: Secondary | ICD-10-CM | POA: Diagnosis not present

## 2021-10-26 DIAGNOSIS — R3129 Other microscopic hematuria: Secondary | ICD-10-CM | POA: Diagnosis not present

## 2021-10-26 DIAGNOSIS — Z0000 Encounter for general adult medical examination without abnormal findings: Secondary | ICD-10-CM | POA: Diagnosis not present

## 2021-10-26 DIAGNOSIS — E7800 Pure hypercholesterolemia, unspecified: Secondary | ICD-10-CM | POA: Diagnosis not present

## 2021-10-31 ENCOUNTER — Other Ambulatory Visit: Payer: Self-pay | Admitting: Internal Medicine

## 2021-10-31 ENCOUNTER — Other Ambulatory Visit: Payer: Self-pay | Admitting: Obstetrics and Gynecology

## 2021-10-31 DIAGNOSIS — Z1231 Encounter for screening mammogram for malignant neoplasm of breast: Secondary | ICD-10-CM

## 2021-11-10 ENCOUNTER — Other Ambulatory Visit: Payer: Self-pay | Admitting: Psychiatry

## 2021-11-10 DIAGNOSIS — F3181 Bipolar II disorder: Secondary | ICD-10-CM

## 2021-11-30 DIAGNOSIS — M62830 Muscle spasm of back: Secondary | ICD-10-CM | POA: Diagnosis not present

## 2021-11-30 DIAGNOSIS — M9902 Segmental and somatic dysfunction of thoracic region: Secondary | ICD-10-CM | POA: Diagnosis not present

## 2021-11-30 DIAGNOSIS — M9901 Segmental and somatic dysfunction of cervical region: Secondary | ICD-10-CM | POA: Diagnosis not present

## 2021-11-30 DIAGNOSIS — R293 Abnormal posture: Secondary | ICD-10-CM | POA: Diagnosis not present

## 2021-11-30 DIAGNOSIS — M6249 Contracture of muscle, multiple sites: Secondary | ICD-10-CM | POA: Diagnosis not present

## 2021-11-30 DIAGNOSIS — M9907 Segmental and somatic dysfunction of upper extremity: Secondary | ICD-10-CM | POA: Diagnosis not present

## 2021-12-01 DIAGNOSIS — M9901 Segmental and somatic dysfunction of cervical region: Secondary | ICD-10-CM | POA: Diagnosis not present

## 2021-12-01 DIAGNOSIS — M62830 Muscle spasm of back: Secondary | ICD-10-CM | POA: Diagnosis not present

## 2021-12-01 DIAGNOSIS — R293 Abnormal posture: Secondary | ICD-10-CM | POA: Diagnosis not present

## 2021-12-01 DIAGNOSIS — M9907 Segmental and somatic dysfunction of upper extremity: Secondary | ICD-10-CM | POA: Diagnosis not present

## 2021-12-01 DIAGNOSIS — M9902 Segmental and somatic dysfunction of thoracic region: Secondary | ICD-10-CM | POA: Diagnosis not present

## 2021-12-01 DIAGNOSIS — M6249 Contracture of muscle, multiple sites: Secondary | ICD-10-CM | POA: Diagnosis not present

## 2021-12-05 DIAGNOSIS — M6249 Contracture of muscle, multiple sites: Secondary | ICD-10-CM | POA: Diagnosis not present

## 2021-12-05 DIAGNOSIS — R293 Abnormal posture: Secondary | ICD-10-CM | POA: Diagnosis not present

## 2021-12-05 DIAGNOSIS — M9907 Segmental and somatic dysfunction of upper extremity: Secondary | ICD-10-CM | POA: Diagnosis not present

## 2021-12-05 DIAGNOSIS — M62830 Muscle spasm of back: Secondary | ICD-10-CM | POA: Diagnosis not present

## 2021-12-05 DIAGNOSIS — M9901 Segmental and somatic dysfunction of cervical region: Secondary | ICD-10-CM | POA: Diagnosis not present

## 2021-12-05 DIAGNOSIS — M9902 Segmental and somatic dysfunction of thoracic region: Secondary | ICD-10-CM | POA: Diagnosis not present

## 2021-12-07 DIAGNOSIS — R293 Abnormal posture: Secondary | ICD-10-CM | POA: Diagnosis not present

## 2021-12-07 DIAGNOSIS — M62830 Muscle spasm of back: Secondary | ICD-10-CM | POA: Diagnosis not present

## 2021-12-07 DIAGNOSIS — M6249 Contracture of muscle, multiple sites: Secondary | ICD-10-CM | POA: Diagnosis not present

## 2021-12-07 DIAGNOSIS — M9902 Segmental and somatic dysfunction of thoracic region: Secondary | ICD-10-CM | POA: Diagnosis not present

## 2021-12-07 DIAGNOSIS — M9901 Segmental and somatic dysfunction of cervical region: Secondary | ICD-10-CM | POA: Diagnosis not present

## 2021-12-07 DIAGNOSIS — M9907 Segmental and somatic dysfunction of upper extremity: Secondary | ICD-10-CM | POA: Diagnosis not present

## 2021-12-10 ENCOUNTER — Other Ambulatory Visit: Payer: Self-pay | Admitting: Psychiatry

## 2021-12-10 DIAGNOSIS — F411 Generalized anxiety disorder: Secondary | ICD-10-CM

## 2021-12-12 ENCOUNTER — Ambulatory Visit (INDEPENDENT_AMBULATORY_CARE_PROVIDER_SITE_OTHER): Payer: 59 | Admitting: Psychiatry

## 2021-12-12 ENCOUNTER — Encounter: Payer: Self-pay | Admitting: Psychiatry

## 2021-12-12 DIAGNOSIS — F3181 Bipolar II disorder: Secondary | ICD-10-CM | POA: Diagnosis not present

## 2021-12-12 DIAGNOSIS — F422 Mixed obsessional thoughts and acts: Secondary | ICD-10-CM | POA: Diagnosis not present

## 2021-12-12 DIAGNOSIS — M9901 Segmental and somatic dysfunction of cervical region: Secondary | ICD-10-CM | POA: Diagnosis not present

## 2021-12-12 DIAGNOSIS — M62830 Muscle spasm of back: Secondary | ICD-10-CM | POA: Diagnosis not present

## 2021-12-12 DIAGNOSIS — M9907 Segmental and somatic dysfunction of upper extremity: Secondary | ICD-10-CM | POA: Diagnosis not present

## 2021-12-12 DIAGNOSIS — F5105 Insomnia due to other mental disorder: Secondary | ICD-10-CM | POA: Diagnosis not present

## 2021-12-12 DIAGNOSIS — M6249 Contracture of muscle, multiple sites: Secondary | ICD-10-CM | POA: Diagnosis not present

## 2021-12-12 DIAGNOSIS — R293 Abnormal posture: Secondary | ICD-10-CM | POA: Diagnosis not present

## 2021-12-12 DIAGNOSIS — F3281 Premenstrual dysphoric disorder: Secondary | ICD-10-CM

## 2021-12-12 DIAGNOSIS — F411 Generalized anxiety disorder: Secondary | ICD-10-CM | POA: Diagnosis not present

## 2021-12-12 DIAGNOSIS — R69 Illness, unspecified: Secondary | ICD-10-CM | POA: Diagnosis not present

## 2021-12-12 DIAGNOSIS — M9902 Segmental and somatic dysfunction of thoracic region: Secondary | ICD-10-CM | POA: Diagnosis not present

## 2021-12-12 MED ORDER — CARBAMAZEPINE 100 MG PO CHEW: 200.0000 mg | CHEWABLE_TABLET | Freq: Every day | ORAL | 1 refills | Status: DC

## 2021-12-12 MED ORDER — BUPROPION HCL ER (SR) 150 MG PO TB12: 150.0000 mg | ORAL_TABLET | Freq: Every day | ORAL | 1 refills | Status: DC

## 2021-12-12 MED ORDER — SERTRALINE HCL 50 MG PO TABS: 50.0000 mg | ORAL_TABLET | Freq: Every day | ORAL | 0 refills | Status: DC

## 2021-12-12 MED ORDER — LAMOTRIGINE 200 MG PO TABS: ORAL_TABLET | ORAL | 1 refills | Status: DC

## 2021-12-12 MED ORDER — ALPRAZOLAM 1 MG PO TABS: 1.0000 mg | ORAL_TABLET | Freq: Two times a day (BID) | ORAL | 1 refills | Status: DC | PRN

## 2021-12-12 MED ORDER — CARBAMAZEPINE ER 100 MG PO TB12: 100.0000 mg | ORAL_TABLET | Freq: Every morning | ORAL | 1 refills | Status: DC

## 2021-12-12 MED ORDER — CARBAMAZEPINE ER 200 MG PO TB12: ORAL_TABLET | ORAL | 1 refills | Status: DC

## 2021-12-12 NOTE — Progress Notes (Signed)
Robin Daniel [id number] 02-28-1975 46 y.o.    Subjective:   Patient ID:  Robin Daniel is a 46 y.o. (DOB July 14, 1975) female.  Chief Complaint:  Chief Complaint  Patient presents with   Follow-up    Bipolar II disorder ([place])   Anxiety    Depression        Associated symptoms include no decreased concentration and no suicidal ideas.  Past medical history includes anxiety.   Anxiety Patient reports no confusion, decreased concentration, nervous/anxious behavior or suicidal ideas.     Robin Daniel call yesterday for an urgent visit because of problems keeping her meds straight with resultant side effect issues.  Follow-up med changes and mood.  At visit Jun 06, 2018.  Insurance problems not covering Equetro resulted in the patient having to switch to a mixture of Tegretol-XR 100 mg tablets, 200 mg Tegretol-XR tablets, and immediate release to 200 mg carbamazepine at night this is resulted in some patient confusion about how to take her medicines and she has called a couple of times since her last appointment having mixed her medications up and having problems.  seen October 2020.  The following was noted and no meds were changed. Overall doing well.  No sig mood swings except PMDD.  Anxiety is manageable.  No major mood swings.  Trouble with getting enough fiber.  Taken Miralax for years.   Reduced lamotrigine in August 2020 DT dizziness and the dizziness resolved.  06/12/2019 appointment, the following is noted:  Occ Ambien.  More often alprazolam 0.5 mg HS. Usually good sleep and rested. 7 hours Good except perimenopausal.  Irregular and heavy periods and can cause her to feel pretty crappy. Avoiding replacement of hormones.  Otherwise good mood most of the time.  Not depressed since here but can feel agitated and bloated. No GI sx since reducing lamotrigine to current dose. Plan no significant med changes.  02/05/2020 appointment with the following  noted: Son 36 yo homeschooling since Robin Daniel. Mood "goodish".  Covid last month.  Recovered well except put me in a slump more depressed.  With Covid had HA and nausea and altered taste.   More depressed and ruminating lately too. Also stress medical problems.  Wearing me down.  Anxiety around repeated breast issues.      Plan: No med changes  07/27/2020 appointment with the following noted: Robin Daniel is 46 yo.   Alternates between alprazolam and Ambien at night to reduce tolerance.  That seems to work. Mood has been good. No complaints about meds.  Cycle is less than in past but only once every few mos.  Still gets some PMS sx.  Irrational thoughts 7 days per month and feels like a failure as mother, wife, irritable.  H notices it. Needs new therapist. Patient denies difficulty with sleep initiation or maintenance. Still struggles with chronic anxiety and negative thoughts about herself and lately health related.  Denies appetite disturbance.  Patient reports that energy and motivation have been good.  Patient denies any difficulty with concentration.  Patient denies any suicidal ideation. Also is irritalble and anxious with period.    01/26/2021 appt noted: Good for the most part.  Covid early December and prednisone for a month then menstrual c ycle.  Don't feel as well.  Struggling more with fear and death thoughts.  Yesterday really tired.  Appetite on and off.  Mind goes straight to a dark place with worry and spirals. Not felt well for a couple of weeks  and hates it.  Worries over it.  Fearful of medical evaluations and sx.  Normal mammogram.  Chronic medical worry.  How am I going to handle it if something happens for real.  03/04/2021 appt noted:  with H This week seemed to need the sertraline longer.  Become OC about health things.  Started getting  compulsive seeing doctor.  7 days/month was not enough.  Exaggerated somatic fears.  Couldn't reason and was freaking out.  Does see benefit with  sertraline markedly.   H sees it as OC.  Seems ok on the other days but the bad days are intense. No mania.    04/27/2021 appointment with the following noted: Ambien works if rotates with Xanax for sleep.  Occ Xanax for anxiety. Today feels good but crappy the last week. Hormonally related mood problems and sees benefit with sdertraline usually.  When it happens gets unrealistic health obsessions and more depressed and hopeless but it passes.   Doesn't take more than 14 sertraline in a month but worries about taking it too much. Irregular cycles.  Sometimes doesn't feel in control of emotions.  Hard getting older and having trouble accepting this.   Never taking hormones. Worries about memory.  08/08/21 appt noted: Mood pretty steady.  Sertraline really helped a lot prn and working out better than scheduled. Pleased with meds. About 6 weeks ago decided to eat more and specifically more protein and weight trained for 6 weeks and feels better doing so. Taiking in from 45 g protein to over 100 G protein and feels better. Ok with sleep generally. Robin Daniel 46 yo wants to RT school instead of home school and going to private school. Going to school church. Plan: no med changes  12/12/21 appt noted: Satisfied with meds.  Around period is afraid of bad things happening, accidents, illness and some when not hormonal. Thinks of death too much.  Has fantastic life and shouldn't be so afraid.  Good support.  Great life and H.  Takes sertraline prn for a week per month and as needed.   Wants a Social worker and needs someone in [place].   Yoga daily.  But neck pain is a problem. Not depressed. Tolerating meds.   Past Psychiatric Medication Trials: Carbamazepine 900, lamotrigine XR 200 mg twice daily, Wellbutrin SR 100 mg twice daily,  naltrexone, Abilify,  Trintellix, paroxetine weakness,  sertraline, other antidepressants stimulants caused mania,  alprazolam, Ambien,  N-acetylcysteine,  topiramate,    She also has had a good response to the full dose of Equetro.  Review of Systems: No tremor no weakness no nausea vomiting. No dizziness. Less Fatigue lately. Definite residual anxiety Problems with pain in teeth.  Good checkup.  Neck issues and chiropracter.  Medications: I have reviewed the patient's current medications.  Current Outpatient Medications  Medication Sig Dispense Refill   bimatoprost (LATISSE) 0.03 % ophthalmic solution PLACE 1 DROP ON APPLICATOR & APPLY EVENLY AT THE BASE OF EYELASHES NIGHTLY ON BOTH EYES.     HYDROcodone-acetaminophen (NORCO/VICODIN) 5-325 MG tablet Take 1 tablet by mouth every 6 (six) hours as needed for moderate pain. 15 tablet 0   hydrocortisone 2.5 % cream Apply topically 2 (two) times daily as needed (Rash). Use for up to 1 week. Apply to eyelids for rash 3.5 g 0   levothyroxine (SYNTHROID, LEVOTHROID) 50 MCG tablet Take 1 tablet by mouth every morning.  5   zolpidem (AMBIEN) 10 MG tablet Take 0.5-1 tablets (5-10 mg total) by mouth at  bedtime as needed for sleep. 30 tablet 3   ALPRAZolam (XANAX) 1 MG tablet Take 1 tablet (1 mg total) by mouth 2 (two) times daily as needed for anxiety. 45 tablet 1   buPROPion (WELLBUTRIN SR) 150 MG 12 hr tablet Take 1 tablet (150 mg total) by mouth daily. 90 tablet 1   carbamazepine (TEGRETOL XR) 100 MG 12 hr tablet Take 1 tablet (100 mg total) by mouth every morning. 90 tablet 1   carbamazepine (TEGRETOL XR) 200 MG 12 hr tablet TAKE ONE TABLET BY MOUTH EVERY MORNING TAKE TWO TABLETS BY MOUTH AT BEDTIME 90 tablet 1   carbamazepine (TEGRETOL) 100 MG chewable tablet Chew 2 tablets (200 mg total) by mouth at bedtime. 180 tablet 1   lamoTRIgine (LAMICTAL) 200 MG tablet TAKE 1/2 TABLET BY MOUTH AT LUNCH AND 1 TABLET BY MOUTH AT NIGHT 135 tablet 1   sertraline (ZOLOFT) 50 MG tablet Take 1 tablet (50 mg total) by mouth daily. 90 tablet 0   No current facility-administered medications for this visit.    Medication Side  Effects: None no longer dizziness, nausea  Allergies: No Known Allergies  Past Medical History:  Diagnosis Date   Bipolar disorder ([place])    Hypothyroidism    Melanoma ([place])    Right back. 2021. Breslow's 1.28m, Clark's level IV, mitotic index 6/mm2    Family History  Problem Relation Age of Onset   Anxiety disorder Father    Breast cancer Neg Hx     Social History   Socioeconomic History   Marital status: Married    Spouse name: Not on file   Number of children: Not on file   Years of education: Not on file   Highest education level: Not on file  Occupational History   Not on file  Tobacco Use   Smoking status: Never   Smokeless tobacco: Never  Substance and Sexual Activity   Alcohol use: No   Drug use: No   Sexual activity: Not on file  Other Topics Concern   Not on file  Social History Narrative   Not on file   Social Determinants of Health   Financial Resource Strain: Not on file  Food Insecurity: Not on file  Transportation Needs: Not on file  Physical Activity: Not on file  Stress: Not on file  Social Connections: Not on file  Intimate Partner Violence: Not on file    Past Medical History, Surgical history, Social history, and Family history were reviewed and updated as appropriate.   Please see review of systems for further details on the patient's review from today.   Objective:   Physical Exam:  There were no vitals taken for this visit.  Physical Exam Constitutional:      General: She is not in acute distress.    Appearance: She is well-developed.  Musculoskeletal:        General: No deformity.  Neurological:     Mental Status: She is alert and oriented to person, place, and time.     Cranial Nerves: No dysarthria.     Coordination: Coordination normal.  Psychiatric:        Attention and Perception: Attention normal. She does not perceive auditory hallucinations.        Mood and Affect: Mood is anxious. Mood is not depressed. Affect is  not labile, blunt, angry, tearful or inappropriate.        Speech: Speech normal.        Behavior: Behavior normal. Behavior is  cooperative.        Thought Content: Thought content normal. Thought content is not paranoid or delusional. Thought content does not include homicidal or suicidal ideation. Thought content does not include suicidal plan.        Cognition and Memory: Cognition and memory normal.        Judgment: Judgment normal.     Comments: Insight good.   Episodic health obsessions Exaggerated medical fears and obsessions still a problem but she is working on it.     Lab Review:  No results found for: "NA", "K", "CL", "CO2", "GLUCOSE", "BUN", "CREATININE", "CALCIUM", "PROT", "ALBUMIN", "AST", "ALT", "ALKPHOS", "BILITOT", "GFRNONAA", "GFRAA"     Component Value Date/Time   WBC 16.2 (H) 10/29/2007 0505   RBC 2.70 (L) 10/29/2007 0505   HGB 8.7 DELTA CHECK NOTED (L) 10/29/2007 0505   HCT 25.9 (L) 10/29/2007 0505   PLT 183 10/29/2007 0505   MCV 95.8 10/29/2007 0505   MCHC 33.6 10/29/2007 0505   RDW 14.8 10/29/2007 0505    No results found for: "POCLITH", "LITHIUM"   Lab Results  Component Value Date   CBMZ 11.1 11/09/2017     .res Assessment: Plan:    Kiosha was seen today for follow-up and anxiety.  Diagnoses and all orders for this visit:  Bipolar II disorder ([place]) -     buPROPion (WELLBUTRIN SR) 150 MG 12 hr tablet; Take 1 tablet (150 mg total) by mouth daily. -     carbamazepine (TEGRETOL) 100 MG chewable tablet; Chew 2 tablets (200 mg total) by mouth at bedtime. -     carbamazepine (TEGRETOL XR) 200 MG 12 hr tablet; TAKE ONE TABLET BY MOUTH EVERY MORNING TAKE TWO TABLETS BY MOUTH AT BEDTIME -     carbamazepine (TEGRETOL XR) 100 MG 12 hr tablet; Take 1 tablet (100 mg total) by mouth every morning. -     lamoTRIgine (LAMICTAL) 200 MG tablet; TAKE 1/2 TABLET BY MOUTH AT LUNCH AND 1 TABLET BY MOUTH AT NIGHT  PMDD (premenstrual dysphoric disorder) -     sertraline  (ZOLOFT) 50 MG tablet; Take 1 tablet (50 mg total) by mouth daily.  Generalized anxiety disorder -     ALPRAZolam (XANAX) 1 MG tablet; Take 1 tablet (1 mg total) by mouth 2 (two) times daily as needed for anxiety.  OCD - safety and infestation focused  Insomnia due to mental condition     Greater than 50% of face to face time with patient was spent on counseling and coordination of care. We discussed Patient was stable on 900 mg of Equetro for an extended period of time.  However she eventually started developing side effects of dizziness and nausea and vomiting and could no longer tolerate that dosage.  The dosage was cut back to 600 mg and she has subsequently developed a mixture of obsessive-compulsive and irritable hypomanic symptoms.  She was having unusual intrusive obsessive thoughts which were about lice and then about Covid.  She had been successful at transitioning from [facility] to a mixture of immediate release and extended release carbamazepine.  She was tolerating the 900 mg daily which had markedly reduced the intrusive obsessive thoughts about Covid and other obsessive anxious thoughts.  It had helped to reduce the dysphoric mixed mood symptoms.    Sx with anxiety partially controlled  including PMDD but not ideal.  Some benefit with prn sertraline. Disc role protein in diet plays in response to meds for mood  Patient's insurance would not  pay for Tegretol XR 100 mg tablets 3 in the morning and 4 at night because of quantity limits.   Therefore the prescription was rewritten Tegretol-XR 100 mg tablets 1 in the morning #30 and 1 refill and Tegretol-XR 200 mg tablets 1 in the morning and 2 at night #90 and 1 refill.  she is also to take short acting carbamazepine 100 mg tablets 2 at night.  Continue lamotrigine 100 mg at noon and 200 mg at night for bipolar depression  Continue the Wellbutrin SR 150 mg in the morning as she feels that is helpful for energy and appetite control.  And  somewhat for attention and focus.  Continue alternating Xanax and Ambien at night but is also taking it prn for anxiety.  I have okayed number 45/month  Sertraline 50 mg prn PMDD.  Use prn has worked.  She is taking an unusual mix between long-acting and short acting carbamazepine in order to achieve maximum mood benefit but avoid side effects of nausea and dizziness without this combination and also dictated by insurance limitations.  Disc immune response and depression probably explains recent increased depression.  Misses some of the lamotrigine.  We discussed the short-term risks associated with benzodiazepines including sedation and increased fall risk among others.  Discussed long-term side effect risk including dependence, potential withdrawal symptoms, and the potential eventual dose-related risk of dementia.  But recent studies from 2020 dispute this association between benzodiazepines and dementia risk. Newer studies in 2020 do not support an association with dementia.  Disc resuming some type of counseling.  This appt was 30 mins.  FU 4-6 mos  Lynder Parents, MD, DFAPA   Please see After Visit Summary for patient specific instructions.   Future Appointments  Date Time Provider [place]  01/26/2022  8:00 AM [facility]  02/21/2022  9:00 AM Rubie Maid, MD [facility] None  04/13/2022  8:50 AM [place], [place], MD [facility] None    No orders of the defined types were placed in this encounter.      -------------------------------

## 2021-12-14 DIAGNOSIS — R293 Abnormal posture: Secondary | ICD-10-CM | POA: Diagnosis not present

## 2021-12-14 DIAGNOSIS — M9902 Segmental and somatic dysfunction of thoracic region: Secondary | ICD-10-CM | POA: Diagnosis not present

## 2021-12-14 DIAGNOSIS — M9901 Segmental and somatic dysfunction of cervical region: Secondary | ICD-10-CM | POA: Diagnosis not present

## 2021-12-14 DIAGNOSIS — M9907 Segmental and somatic dysfunction of upper extremity: Secondary | ICD-10-CM | POA: Diagnosis not present

## 2021-12-14 DIAGNOSIS — M62830 Muscle spasm of back: Secondary | ICD-10-CM | POA: Diagnosis not present

## 2021-12-14 DIAGNOSIS — M6249 Contracture of muscle, multiple sites: Secondary | ICD-10-CM | POA: Diagnosis not present

## 2021-12-19 DIAGNOSIS — R293 Abnormal posture: Secondary | ICD-10-CM | POA: Diagnosis not present

## 2021-12-19 DIAGNOSIS — M9907 Segmental and somatic dysfunction of upper extremity: Secondary | ICD-10-CM | POA: Diagnosis not present

## 2021-12-19 DIAGNOSIS — M9902 Segmental and somatic dysfunction of thoracic region: Secondary | ICD-10-CM | POA: Diagnosis not present

## 2021-12-19 DIAGNOSIS — M62830 Muscle spasm of back: Secondary | ICD-10-CM | POA: Diagnosis not present

## 2021-12-19 DIAGNOSIS — M6249 Contracture of muscle, multiple sites: Secondary | ICD-10-CM | POA: Diagnosis not present

## 2021-12-19 DIAGNOSIS — M9901 Segmental and somatic dysfunction of cervical region: Secondary | ICD-10-CM | POA: Diagnosis not present

## 2021-12-26 DIAGNOSIS — M6249 Contracture of muscle, multiple sites: Secondary | ICD-10-CM | POA: Diagnosis not present

## 2021-12-26 DIAGNOSIS — R293 Abnormal posture: Secondary | ICD-10-CM | POA: Diagnosis not present

## 2021-12-26 DIAGNOSIS — M9901 Segmental and somatic dysfunction of cervical region: Secondary | ICD-10-CM | POA: Diagnosis not present

## 2021-12-26 DIAGNOSIS — M9902 Segmental and somatic dysfunction of thoracic region: Secondary | ICD-10-CM | POA: Diagnosis not present

## 2021-12-26 DIAGNOSIS — M62830 Muscle spasm of back: Secondary | ICD-10-CM | POA: Diagnosis not present

## 2021-12-26 DIAGNOSIS — M9907 Segmental and somatic dysfunction of upper extremity: Secondary | ICD-10-CM | POA: Diagnosis not present

## 2022-01-10 DIAGNOSIS — M9902 Segmental and somatic dysfunction of thoracic region: Secondary | ICD-10-CM | POA: Diagnosis not present

## 2022-01-10 DIAGNOSIS — M9901 Segmental and somatic dysfunction of cervical region: Secondary | ICD-10-CM | POA: Diagnosis not present

## 2022-01-10 DIAGNOSIS — M62830 Muscle spasm of back: Secondary | ICD-10-CM | POA: Diagnosis not present

## 2022-01-10 DIAGNOSIS — M6249 Contracture of muscle, multiple sites: Secondary | ICD-10-CM | POA: Diagnosis not present

## 2022-01-10 DIAGNOSIS — M9907 Segmental and somatic dysfunction of upper extremity: Secondary | ICD-10-CM | POA: Diagnosis not present

## 2022-01-10 DIAGNOSIS — R293 Abnormal posture: Secondary | ICD-10-CM | POA: Diagnosis not present

## 2022-01-26 ENCOUNTER — Ambulatory Visit
Admission: RE | Admit: 2022-01-26 | Discharge: 2022-01-26 | Disposition: A | Discharge status: Home / Self Care | Payer: 59 | Source: Ambulatory Visit | Attending: Internal Medicine | Admitting: Internal Medicine

## 2022-01-26 DIAGNOSIS — Z1231 Encounter for screening mammogram for malignant neoplasm of breast: Secondary | ICD-10-CM | POA: Insufficient documentation

## 2022-02-20 NOTE — Patient Instructions (Incomplete)

## 2022-02-20 NOTE — Progress Notes (Unsigned)
GYNECOLOGY ANNUAL PHYSICAL EXAM PROGRESS NOTE  Subjective:    Robin Daniel is a 47 y.o. G27P1001 female who presents for an annual exam. Is very anxious with her physical exams. The patient is sexually active. The patient participates in regular exercise: yes. Has the patient ever been transfused or tattooed?: no. The patient reports that there is not domestic violence in her life.   The patient has the following complaints today:  Just had 2 regular cycles over the past few months, previously had gone ~ 6-7 months without one. Was also noting less hair growth under arms and on legs during those months, but now has also returned to normal. Notes that she has gained a small amount of weight. Wonders if this made her cycles return or if she is just in perimenopause.   Menstrual History: Menarche age: 55 Patient's last menstrual period was 01/23/2022. Period Cycle (Days): 28 Period Duration (Days): 5-6 Period Pattern: (!) Irregular Menstrual Flow: Moderate Menstrual Control: Tampon, Panty liner Menstrual Control Change Freq (Hours): 2+ Dysmenorrhea: None   Gynecologic History:  Contraception: vasectomy History of STI's: Denies Last Pap: 11/24/2019. Results were: normal.  Denies h/o abnormal pap smears. Last mammogram: 01/26/2022. Results were: normal Last Colonoscopy: Cologuard ( Negative) 02/14/2021   OB History  Gravida Para Term Preterm AB Living  '1 1 1 '$ 0 0 0  SAB IAB Ectopic Multiple Live Births  0 0 0 0 1    # Outcome Date GA Lbr Len/2nd Weight Sex Delivery Anes PTL Lv  1 Term             Past Medical History:  Diagnosis Date   Bipolar disorder ([place])    Hypothyroidism    Melanoma ([place])    Right back. 2021. Breslow's 1.39m, Clark's level IV, mitotic index 6/mm2    Past Surgical History:  Procedure Laterality Date   BREAST BIOPSY Right 12/12/2019   Benign   METATARSAL OSTEOTOMY WITH BUNIONECTOMY     NEVUS EXCISION Right 08/29/2019   Procedure:  EXCISION OF ATYPICAL NEVUS FROM RIGHT BACK;  Surgeon: MJohnathan Hausen MD;  [facility]  Service: General;  Laterality: Right;    Family History  Problem Relation Age of Onset   Anxiety disorder Father    Breast cancer Neg Hx     Social History   Socioeconomic History   Marital status: Married    Spouse name: Not on file   Number of children: Not on file   Years of education: Not on file   Highest education level: Not on file  Occupational History   Not on file  Tobacco Use   Smoking status: Never   Smokeless tobacco: Never  Substance and Sexual Activity   Alcohol use: No   Drug use: No   Sexual activity: Not on file  Other Topics Concern   Not on file  Social History Narrative   Not on file   Social Determinants of Health   Financial Resource Strain: Not on file  Food Insecurity: Not on file  Transportation Needs: Not on file  Physical Activity: Not on file  Stress: Not on file  Social Connections: Not on file  Intimate Partner Violence: Not on file    Current Outpatient Medications on File Prior to Visit  Medication Sig Dispense Refill   ALPRAZolam (XANAX) 1 MG tablet Take 1 tablet (1 mg total) by mouth 2 (two) times daily as needed for anxiety. 45 tablet 1   bimatoprost (LATISSE)  0.03 % ophthalmic solution PLACE 1 DROP ON APPLICATOR & APPLY EVENLY AT THE BASE OF EYELASHES NIGHTLY ON BOTH EYES.     buPROPion (WELLBUTRIN SR) 150 MG 12 hr tablet Take 1 tablet (150 mg total) by mouth daily. 90 tablet 1   carbamazepine (TEGRETOL XR) 100 MG 12 hr tablet Take 1 tablet (100 mg total) by mouth every morning. 90 tablet 1   carbamazepine (TEGRETOL XR) 200 MG 12 hr tablet TAKE ONE TABLET BY MOUTH EVERY MORNING TAKE TWO TABLETS BY MOUTH AT BEDTIME 90 tablet 1   carbamazepine (TEGRETOL) 100 MG chewable tablet Chew 2 tablets (200 mg total) by mouth at bedtime. 180 tablet 1   HYDROcodone-acetaminophen (NORCO/VICODIN) 5-325 MG tablet Take 1 tablet by mouth  every 6 (six) hours as needed for moderate pain. 15 tablet 0   hydrocortisone 2.5 % cream Apply topically 2 (two) times daily as needed (Rash). Use for up to 1 week. Apply to eyelids for rash 3.5 g 0   lamoTRIgine (LAMICTAL) 200 MG tablet TAKE 1/2 TABLET BY MOUTH AT LUNCH AND 1 TABLET BY MOUTH AT NIGHT 135 tablet 1   levothyroxine (SYNTHROID, LEVOTHROID) 50 MCG tablet Take 1 tablet by mouth every morning.  5   sertraline (ZOLOFT) 50 MG tablet Take 1 tablet (50 mg total) by mouth daily. 90 tablet 0   zolpidem (AMBIEN) 10 MG tablet Take 0.5-1 tablets (5-10 mg total) by mouth at bedtime as needed for sleep. 30 tablet 3   No current facility-administered medications on file prior to visit.    No Known Allergies   Review of Systems Constitutional: negative for chills, fatigue, fevers and sweats Eyes: negative for irritation, redness and visual disturbance Ears, nose, mouth, throat, and face: negative for hearing loss, nasal congestion, snoring and tinnitus Respiratory: negative for asthma, cough, sputum Cardiovascular: negative for chest pain, dyspnea, exertional chest pressure/discomfort, irregular heart beat, palpitations and syncope Gastrointestinal: negative for abdominal pain, change in bowel habits, nausea and vomiting Genitourinary: negative for abnormal menstrual periods, genital lesions, sexual problems and vaginal discharge, dysuria and urinary incontinence Integument/breast: negative for breast lump, breast tenderness and nipple discharge Hematologic/lymphatic: negative for bleeding and easy bruising Musculoskeletal:negative for back pain and muscle weakness Neurological: negative for dizziness, headaches, vertigo and weakness Endocrine: negative for diabetic symptoms including polydipsia, polyuria and skin dryness Allergic/Immunologic: negative for hay fever and urticaria      Objective:  Last menstrual period 01/23/2022. Body mass index is 20.65 kg/m. Blood pressure 97/85,  pulse 78, resp. rate 16, height '5\' 4"'$  (1.626 m), weight 120 lb 4.8 oz (54.6 kg), last menstrual period 02/15/2022.    General Appearance:    Alert, cooperative, no distress, appears stated age  Head:    Normocephalic, without obvious abnormality, atraumatic  Eyes:    PERRL, conjunctiva/corneas clear, EOM's intact, both eyes  Ears:    Normal external ear canals, both ears  Nose:   Nares normal, septum midline, mucosa normal, no drainage or sinus tenderness  Throat:   Lips, mucosa, and tongue normal; teeth and gums normal  Neck:   Supple, symmetrical, trachea midline, no adenopathy; thyroid: no enlargement/tenderness/nodules; no carotid bruit or JVD  Back:     Symmetric, no curvature, ROM normal, no CVA tenderness  Lungs:     Clear to auscultation bilaterally, respirations unlabored  Chest Wall:    No tenderness or deformity   Heart:    Regular rate and rhythm, S1 and S2 normal, no murmur, rub or gallop  Breast  Exam:    No tenderness, masses, or nipple abnormality  Abdomen:     Soft, non-tender, bowel sounds active all four quadrants, no masses, no organomegaly.    Genitalia:    Pelvic:external genitalia normal, vagina without lesions, discharge, or tenderness, rectovaginal septum  normal. Cervix normal in appearance, no cervical motion tenderness, no adnexal masses or tenderness.  Uterus normal size, shape, mobile, regular contours, nontender.  Rectal:    Normal external sphincter.  No hemorrhoids appreciated. Internal exam not done.   Extremities:   Extremities normal, atraumatic, no cyanosis or edema  Pulses:   2+ and symmetric all extremities  Skin:   Skin color, texture, turgor normal, no rashes or lesions  Lymph nodes:   Cervical, supraclavicular, and axillary nodes normal  Neurologic:   CNII-XII intact, normal strength, sensation and reflexes throughout   . Labs:  Reviewed in [place], performed by PCP in September 2023.   Assessment:   1. Encounter for well woman exam with  routine gynecological exam   2. Perimenopausal   3. Anxiety      Plan:  Blood tests: Reviewed in Care Everywhere, performed by PCP. Breast self exam technique reviewed and patient encouraged to perform self-exam monthly. Contraception: vasectomy. Discussed healthy lifestyle modifications. Mammogram  up to date Pap smear  up to date. Due in 1 year . COVID vaccination status: Declines Discussed perimenopause, possible changes in cycles due to weight fluctuations.  Patient with h/o anxiety, bipolar, OCD, currently being managed. Follow up in 1 year for annual exam   Rubie Maid, MD [place] [place]

## 2022-02-21 ENCOUNTER — Ambulatory Visit (INDEPENDENT_AMBULATORY_CARE_PROVIDER_SITE_OTHER): Payer: 59 | Admitting: Obstetrics and Gynecology

## 2022-02-21 ENCOUNTER — Encounter: Payer: Self-pay | Admitting: Obstetrics and Gynecology

## 2022-02-21 ENCOUNTER — Other Ambulatory Visit: Payer: Self-pay | Admitting: Psychiatry

## 2022-02-21 VITALS — BP 97/85 | HR 78 | Resp 16 | Ht 64.0 in | Wt 120.3 lb

## 2022-02-21 DIAGNOSIS — Z1159 Encounter for screening for other viral diseases: Secondary | ICD-10-CM

## 2022-02-21 DIAGNOSIS — Z131 Encounter for screening for diabetes mellitus: Secondary | ICD-10-CM

## 2022-02-21 DIAGNOSIS — F419 Anxiety disorder, unspecified: Secondary | ICD-10-CM

## 2022-02-21 DIAGNOSIS — Z01419 Encounter for gynecological examination (general) (routine) without abnormal findings: Secondary | ICD-10-CM | POA: Diagnosis not present

## 2022-02-21 DIAGNOSIS — N951 Menopausal and female climacteric states: Secondary | ICD-10-CM | POA: Diagnosis not present

## 2022-02-21 DIAGNOSIS — Z13220 Encounter for screening for lipoid disorders: Secondary | ICD-10-CM

## 2022-02-21 DIAGNOSIS — Z113 Encounter for screening for infections with a predominantly sexual mode of transmission: Secondary | ICD-10-CM

## 2022-02-21 DIAGNOSIS — F3181 Bipolar II disorder: Secondary | ICD-10-CM

## 2022-03-25 ENCOUNTER — Encounter: Payer: Self-pay | Admitting: Obstetrics and Gynecology

## 2022-04-13 ENCOUNTER — Encounter: Payer: 59 | Admitting: Dermatology

## 2022-05-11 ENCOUNTER — Encounter: Payer: Self-pay | Admitting: Dermatology

## 2022-05-11 ENCOUNTER — Ambulatory Visit (INDEPENDENT_AMBULATORY_CARE_PROVIDER_SITE_OTHER): Payer: 59 | Admitting: Dermatology

## 2022-05-11 VITALS — BP 103/69

## 2022-05-11 DIAGNOSIS — L578 Other skin changes due to chronic exposure to nonionizing radiation: Secondary | ICD-10-CM

## 2022-05-11 DIAGNOSIS — D229 Melanocytic nevi, unspecified: Secondary | ICD-10-CM

## 2022-05-11 DIAGNOSIS — Z1283 Encounter for screening for malignant neoplasm of skin: Secondary | ICD-10-CM | POA: Diagnosis not present

## 2022-05-11 DIAGNOSIS — L814 Other melanin hyperpigmentation: Secondary | ICD-10-CM

## 2022-05-11 DIAGNOSIS — L988 Other specified disorders of the skin and subcutaneous tissue: Secondary | ICD-10-CM

## 2022-05-11 DIAGNOSIS — L821 Other seborrheic keratosis: Secondary | ICD-10-CM

## 2022-05-11 DIAGNOSIS — Z85820 Personal history of malignant melanoma of skin: Secondary | ICD-10-CM

## 2022-05-11 MED ORDER — ALTRENO 0.05 % EX LOTN: 1.0000 | TOPICAL_LOTION | Freq: Every day | CUTANEOUS | 11 refills | Status: AC

## 2022-05-11 NOTE — Progress Notes (Signed)
Follow-Up Visit   Subjective  Robin Daniel is a 47 y.o. female who presents for the following: Skin Cancer Screening and Full Body Skin Exam, hx of Melanoma R back 2021, check spot L medial canthus area, noticed ~2wks ago, pt thought r/t to glasss  The patient presents for Total-Body Skin Exam (TBSE) for skin cancer screening and mole check. The patient has spots, moles and lesions to be evaluated, some may be new or changing and the patient has concerns that these could be cancer.    The following portions of the chart were reviewed this encounter and updated as appropriate: medications, allergies, medical history  Review of Systems:  No other skin or systemic complaints except as noted in HPI or Assessment and Plan.  Objective  Well appearing patient in no apparent distress; mood and affect are within normal limits.  A full examination was performed including scalp, head, eyes, ears, nose, lips, neck, chest, axillae, abdomen, back, buttocks, bilateral upper extremities, bilateral lower extremities, hands, feet, fingers, toes, fingernails, and toenails. All findings within normal limits unless otherwise noted below.   Relevant physical exam findings are noted in the Assessment and Plan.    Assessment & Plan   LENTIGINES, SEBORRHEIC KERATOSES, HEMANGIOMAS - Benign normal skin lesions - Benign-appearing - Call for any changes  MELANOCYTIC NEVI - Tan-brown and/or pink-flesh-colored symmetric macules and papules - Benign appearing on exam today - Observation - Call clinic for new or changing moles - Recommend daily use of broad spectrum spf 30+ sunscreen to sun-exposed areas.   ACTINIC DAMAGE - Chronic condition, secondary to cumulative UV/sun exposure - diffuse scaly erythematous macules with underlying dyspigmentation - Recommend daily broad spectrum sunscreen SPF 30+ to sun-exposed areas, reapply every 2 hours as needed.  - Staying in the shade or wearing long  sleeves, sun glasses (UVA+UVB protection) and wide brim hats (4-inch brim around the entire circumference of the hat) are also recommended for sun protection.  - Call for new or changing lesions.  SKIN CANCER SCREENING PERFORMED TODAY.  HISTORY OF MELANOMA - No evidence of recurrence today - No lymphadenopathy - Recommend regular full body skin exams - Recommend daily broad spectrum sunscreen SPF 30+ to sun-exposed areas, reapply every 2 hours as needed.  - Call if any new or changing lesions are noted between office visits  - R back, excised 2021. Breslow's 1.21mm, Clark's level IV, mitotic index 6/mm2   Telangiectasia - Dilated blood vessel - Benign appearing on exam - Call for changes  - L medial canthus Counseling for BBL / IPL / Laser and Coordination of Care Discussed the treatment option of Broad Band Light (BBL) /Intense Pulsed Light (IPL)/ Laser for skin discoloration, including brown spots and redness.  Typically we recommend at least 1-3 treatment sessions about 5-8 weeks apart for best results.  Cannot have tanned skin when BBL performed, and regular use of sunscreen is advised after the procedure to help maintain results. The patient's condition may also require "maintenance treatments" in the future.  The fee for BBL / laser treatments is $200 per treatment session for one spot..  A fee can be quoted for other parts of the body.  Insurance typically does not pay for BBL/laser treatments and therefore the fee is an out-of-pocket cost.  FACIAL ELASTOSIS Exam: Rhytides and volume loss.  Treatment Plan: Discussed Tretinoin Start Altreno lotion qhs as tolerated Or start SM Anti-Aging Tretinoin 0.0125%/Niacinamide/Vitamin C/Vitamin E/Turmeric/Resveratrol with Hyaluronic Acid. Apply pea sized amount nightly to  the entire face.  The patient was advised this is not covered by insurance since it is made by a compounding pharmacy. They will receive an email to check out and the  medication will be mailed to their home.   Topical retinoid medications like tretinoin can cause dryness and irritation when first started. Only apply a pea-sized amount to the entire affected area. Avoid applying it around the eyes, edges of mouth and creases at the nose. If you experience irritation, use a good moisturizer first and/or apply the medicine less often. If you are doing well with the medicine, you can increase how often you use it until you are applying every night. Be careful with sun protection while using this medication as it can make you sensitive to the sun. This medicine should not be used by pregnant women.   Recommend daily broad spectrum sunscreen SPF 30+ to sun-exposed areas, reapply every 2 hours as needed. Call for new or changing lesions.  Staying in the shade or wearing long sleeves, sun glasses (UVA+UVB protection) and wide brim hats (4-inch brim around the entire circumference of the hat) are also recommended for sun protection.    Topical retinoid medications like tretinoin/Retin-A, adapalene/Differin, tazarotene/Fabior, and Epiduo/Epiduo Forte can cause dryness and irritation when first started. Only apply a pea-sized amount to the entire affected area. Avoid applying it around the eyes, edges of mouth and creases at the nose. If you experience irritation, use a good moisturizer first and/or apply the medicine less often. If you are doing well with the medicine, you can increase how often you use it until you are applying every night. Be careful with sun protection while using this medication as it can make you sensitive to the sun. This medicine should not be used by pregnant women.    Return in about 6 months (around 11/10/2022) for TBSE, Hx of Melanoma.  I, Ardis Rowan, RMA, am acting as scribe for Darden Dates, MD .   Documentation: I have reviewed the above documentation for accuracy and completeness, and I agree with the above.  Darden Dates, MD

## 2022-05-11 NOTE — Patient Instructions (Addendum)
Recommend daily broad spectrum sunscreen SPF 30+ to sun-exposed areas, reapply every 2 hours as needed. Call for new or changing lesions.  Staying in the shade or wearing long sleeves, sun glasses (UVA+UVB protection) and wide brim hats (4-inch brim around the entire circumference of the hat) are also recommended for sun protection.    Recommend taking Heliocare sun protection supplement daily in sunny weather for additional sun protection. For maximum protection on the sunniest days, you can take up to 2 capsules of regular Heliocare OR take 1 capsule of Heliocare Ultra. For prolonged exposure (such as a full day in the sun), you can repeat your dose of the supplement 4 hours after your first dose. Heliocare can be purchased at [employer], at some [facility] or at [email or web address].    There are a few different versions of tretinoin. The least expensive is generic tretinoin 0.025% cream from your regular pharmacy (with a goodrx coupon card to get the best price). The strongest is the Perfect A which we carry at the front desk and which includes vitamin C to help fade dark spots.  The other version I like is made by a compounding pharmacy and includes vitamin C, hyaluronic acid, resveratrol and an antiinflammatory vitamin to work as an "all in one" anti-aging cream. That is around $60 per bottle which lasts usually a couple of months.   Start Altreno or [facility] Tretinoin nightly as tolerated, apply moisturizer then pea size amount of tretinoin then moisturizer [facility] Anti-Aging Tretinoin 0.0125%/Niacinamide/Vitamin C/Vitamin E/Turmeric/Resveratrol with Hyaluronic Acid  Instructions for [facility] Medications  One or more of your medications was sent to the [facility] mail order compounding pharmacy. You will receive an email from them and can purchase the medicine through that link. It will then be mailed to your home at the address you confirmed. If for any reason  you do not receive an email from them, please check your spam folder. If you still do not find the email, please let us know. [facility] phone number is [phone].    Due to recent changes in healthcare laws, you may see results of your pathology and/or laboratory studies on MyChart before the doctors have had a chance to review them. We understand that in some cases there may be results that are confusing or concerning to you. Please understand that not all results are received at the same time and often the doctors may need to interpret multiple results in order to provide you with the best plan of care or course of treatment. Therefore, we ask that you please give [place] 2 business days to thoroughly review all your results before contacting the office for clarification. Should we see a critical lab result, you will be contacted sooner.    If You Need Anything After Your Visit  If you have any questions or concerns for your doctor, please call our main line at [phone] and press option 4 to reach your doctor's medical assistant. If no one answers, please leave a voicemail as directed and we will return your call as soon as possible. Messages left after 4 pm will be answered the following business day.   You may also send [place] a message via MyChart. We typically respond to MyChart messages within 1-2 business days.  For prescription refills, please ask your pharmacy to contact our office. Our fax number is [phone].  If you have an urgent issue when the clinic is closed that cannot wait until the next  business day, you can page your doctor at the number below.    Please note that while we do our best to be available for urgent issues outside of office hours, we are not available 24/7.   If you have an urgent issue and are unable to reach [place], you may choose to seek medical care at your doctor's office, retail clinic, urgent care center, or emergency room.  If you have a medical  emergency, please immediately call 911 or go to the emergency department.  Pager Numbers  - Dr. Gwen Pounds: [phone]  - Dr. Neale Burly: [phone]  - Dr. Roseanne Reno: [phone]  In the event of inclement weather, please call our main line at [phone] for an update on the status of any delays or closures.  Dermatology Medication Tips: Please keep the boxes that topical medications come in in order to help keep track of the instructions about where and how to use these. Pharmacies typically print the medication instructions only on the boxes and not directly on the medication tubes.   If your medication is too expensive, please contact our office at [phone] option 4 or send [place] a message through MyChart.   We are unable to tell what your co-pay for medications will be in advance as this is different depending on your insurance coverage. However, we may be able to find a substitute medication at lower cost or fill out paperwork to get insurance to cover a needed medication.   If a prior authorization is required to get your medication covered by your insurance company, please allow [place] 1-2 business days to complete this process.  Drug prices often vary depending on where the prescription is filled and some pharmacies may offer cheaper prices.  The website www.goodrx.com contains coupons for medications through different pharmacies. The prices here do not account for what the cost may be with help from insurance (it may be cheaper with your insurance), but the website can give you the price if you did not use any insurance.  - You can print the associated coupon and take it with your prescription to the pharmacy.  - You may also stop by our office during regular business hours and pick up a GoodRx coupon card.  - If you need your prescription sent electronically to a different pharmacy, notify our office through [facility] or by phone at [phone] option 4.     Si Usted  Necesita Algo Despus de Su Visita  Tambin puede enviarnos un mensaje a travs de Clinical cytogeneticist. Por lo general respondemos a los mensajes de MyChart en el transcurso de 1 a 2 das hbiles.  Para renovar recetas, por favor pida a su farmacia que se ponga en contacto con nuestra oficina. Annie Sable de fax es [place] [phone].  Si tiene un asunto urgente cuando la clnica est cerrada y que no puede esperar hasta el siguiente da hbil, puede llamar/localizar a su doctor(a) al nmero que aparece a continuacin.   Por favor, tenga en cuenta que aunque hacemos todo lo posible para estar disponibles para asuntos urgentes fuera del horario de [place], no estamos disponibles las 24 horas del da, los 7 [address]  [address] de la [place].   Si tiene un problema urgente y no puede comunicarse con nosotros, puede optar por buscar atencin mdica  en el consultorio de su doctor(a), en una clnica privada, en un centro de atencin urgente o en una sala de emergencias.  Si tiene una emergencia mdica, por favor llame inmediatamente al 911 o  vaya a la sala de emergencias.  Nmeros de bper  - Dr. Gwen Pounds: [phone]  - Dra. Moye: [phone]  - Dra. Roseanne Reno: [phone]  En caso de inclemencias del [place], por favor llame a Lacy Duverney principal al [phone] para una actualizacin sobre el [place] de cualquier retraso o cierre.  Consejos para la medicacin en dermatologa: Por favor, guarde las cajas en las que vienen los medicamentos de uso tpico para ayudarle a seguir las instrucciones sobre dnde y cmo usarlos. Las farmacias generalmente imprimen las instrucciones del medicamento slo en las cajas y no directamente en los tubos del [place].   Si su medicamento es muy caro, por favor, pngase en contacto con Rolm Gala llamando al [phone] y presione la opcin 4 o envenos un mensaje a travs de Clinical cytogeneticist.   No podemos decirle cul ser su copago por los medicamentos por adelantado ya que esto es  diferente dependiendo de la cobertura de su seguro. Sin embargo, es posible que podamos encontrar un medicamento sustituto a Audiological scientist un formulario para que el seguro cubra el medicamento que se considera necesario.   Si se requiere una autorizacin previa para que su compaa de seguros [place] su medicamento, por favor permtanos de 1 a 2 das hbiles para completar [address]  Los precios de los medicamentos varan con frecuencia dependiendo del Environmental consultant de dnde se surte la receta y alguna farmacias pueden ofrecer precios ms baratos.  El sitio web www.goodrx.com tiene cupones para medicamentos de Health and safety inspector. Los precios aqu no tienen en cuenta lo que podra costar con la ayuda del seguro (puede ser ms barato con su seguro), pero el sitio web puede darle el precio si no utiliz Tourist information centre manager.  - Puede imprimir el cupn correspondiente y llevarlo con su receta a la farmacia.  - Tambin puede pasar por nuestra oficina durante el horario de atencin regular y Education officer, museum una tarjeta de cupones de GoodRx.  - Si necesita que su receta se enve electrnicamente a una farmacia diferente, informe a nuestra oficina a travs de MyChart de [place] o por telfono llamando al [phone] y presione la opcin 4.

## 2022-06-12 ENCOUNTER — Telehealth: Payer: Self-pay | Admitting: Psychiatry

## 2022-06-12 ENCOUNTER — Ambulatory Visit: Payer: 59 | Admitting: Psychiatry

## 2022-06-12 ENCOUNTER — Other Ambulatory Visit: Payer: Self-pay | Admitting: Psychiatry

## 2022-06-12 DIAGNOSIS — F3181 Bipolar II disorder: Secondary | ICD-10-CM

## 2022-06-12 DIAGNOSIS — F3281 Premenstrual dysphoric disorder: Secondary | ICD-10-CM

## 2022-06-12 DIAGNOSIS — F5105 Insomnia due to other mental disorder: Secondary | ICD-10-CM

## 2022-06-12 MED ORDER — BUPROPION HCL ER (SR) 150 MG PO TB12: 150.0000 mg | ORAL_TABLET | Freq: Every day | ORAL | 1 refills | Status: DC

## 2022-06-12 MED ORDER — CARBAMAZEPINE ER 100 MG PO TB12: 100.0000 mg | ORAL_TABLET | Freq: Every morning | ORAL | 1 refills | Status: DC

## 2022-06-12 MED ORDER — CARBAMAZEPINE 100 MG PO CHEW: 200.0000 mg | CHEWABLE_TABLET | Freq: Every day | ORAL | 1 refills | Status: DC

## 2022-06-12 MED ORDER — CARBAMAZEPINE ER 200 MG PO TB12: ORAL_TABLET | ORAL | 0 refills | Status: DC

## 2022-06-12 MED ORDER — SERTRALINE HCL 50 MG PO TABS: 50.0000 mg | ORAL_TABLET | Freq: Every day | ORAL | 0 refills | Status: DC

## 2022-06-12 MED ORDER — LAMOTRIGINE 200 MG PO TABS: ORAL_TABLET | ORAL | 0 refills | Status: DC

## 2022-06-12 MED ORDER — ZOLPIDEM TARTRATE 10 MG PO TABS: 5.0000 mg | ORAL_TABLET | Freq: Every evening | ORAL | 0 refills | Status: DC | PRN

## 2022-06-12 NOTE — Progress Notes (Signed)
Change in pharmacies.

## 2022-06-12 NOTE — Telephone Encounter (Signed)
Pt rs her appt to 07/12/22. She had to change pharmacies due to insurance. She needs refills on her xanax and all of her carbamazepine xr 100 mg 200mg  xr and then 100 mg chewable, zoloft 50 mg, wellbutrin sr 150mg  a,lamatrogine 200 mg and her ambien. Her new pharmacy is the [facility] on [address] in [place]. Please update her chart

## 2022-06-13 NOTE — Telephone Encounter (Signed)
Pharmacy profile updated. Dr. Jennelle Human sent in scripts.

## 2022-06-14 ENCOUNTER — Other Ambulatory Visit: Payer: Self-pay

## 2022-06-14 DIAGNOSIS — F411 Generalized anxiety disorder: Secondary | ICD-10-CM

## 2022-06-14 MED ORDER — ALPRAZOLAM 1 MG PO TABS
1.0000 mg | ORAL_TABLET | Freq: Two times a day (BID) | ORAL | 1 refills | Status: DC | PRN
Start: 2022-06-14 — End: 2022-07-12

## 2022-06-16 ENCOUNTER — Other Ambulatory Visit: Payer: Self-pay | Admitting: Psychiatry

## 2022-06-16 DIAGNOSIS — F3181 Bipolar II disorder: Secondary | ICD-10-CM

## 2022-07-12 ENCOUNTER — Ambulatory Visit (INDEPENDENT_AMBULATORY_CARE_PROVIDER_SITE_OTHER): Payer: 59 | Admitting: Psychiatry

## 2022-07-12 ENCOUNTER — Encounter: Payer: Self-pay | Admitting: Psychiatry

## 2022-07-12 DIAGNOSIS — F3181 Bipolar II disorder: Secondary | ICD-10-CM | POA: Diagnosis not present

## 2022-07-12 DIAGNOSIS — F3281 Premenstrual dysphoric disorder: Secondary | ICD-10-CM

## 2022-07-12 DIAGNOSIS — F5105 Insomnia due to other mental disorder: Secondary | ICD-10-CM

## 2022-07-12 DIAGNOSIS — F411 Generalized anxiety disorder: Secondary | ICD-10-CM

## 2022-07-12 MED ORDER — SERTRALINE HCL 50 MG PO TABS: 50.0000 mg | ORAL_TABLET | Freq: Every day | ORAL | 1 refills | Status: DC

## 2022-07-12 MED ORDER — LAMOTRIGINE 200 MG PO TABS: ORAL_TABLET | ORAL | 1 refills | Status: DC

## 2022-07-12 MED ORDER — CARBAMAZEPINE ER 200 MG PO TB12: ORAL_TABLET | ORAL | 1 refills | Status: DC

## 2022-07-12 MED ORDER — CARBAMAZEPINE 100 MG PO CHEW: 200.0000 mg | CHEWABLE_TABLET | Freq: Every day | ORAL | 1 refills | Status: DC

## 2022-07-12 MED ORDER — ALPRAZOLAM 1 MG PO TABS
1.0000 mg | ORAL_TABLET | Freq: Two times a day (BID) | ORAL | 5 refills | Status: DC | PRN
Start: 2022-07-12 — End: 2023-01-25

## 2022-07-12 MED ORDER — CARBAMAZEPINE ER 100 MG PO TB12
100.0000 mg | ORAL_TABLET | Freq: Every morning | ORAL | 1 refills | Status: DC
Start: 2022-07-12 — End: 2023-02-07

## 2022-07-12 NOTE — Progress Notes (Signed)
Robin Daniel [id number] Sep 30, 1975 47 y.o.    Subjective:   Patient ID:  Robin Daniel is a 47 y.o. (DOB 1975/08/23) female.  Chief Complaint:  Chief Complaint  Patient presents with   Follow-up    Depression        Associated symptoms include no decreased concentration and no suicidal ideas.  Past medical history includes anxiety.   Anxiety Patient reports no confusion, decreased concentration, nervous/anxious behavior or suicidal ideas.     Robin Daniel call yesterday for an urgent visit because of problems keeping her meds straight with resultant side effect issues.  Follow-up med changes and mood.  At visit Jun 06, 2018.  Insurance problems not covering Equetro resulted in the patient having to switch to a mixture of Tegretol-XR 100 mg tablets, 200 mg Tegretol-XR tablets, and immediate release to 200 mg carbamazepine at night this is resulted in some patient confusion about how to take her medicines and she has called a couple of times since her last appointment having mixed her medications up and having problems.  seen October 2020.  The following was noted and no meds were changed. Overall doing well.  No sig mood swings except PMDD.  Anxiety is manageable.  No major mood swings.  Trouble with getting enough fiber.  Taken Miralax for years.   Reduced lamotrigine in August 2020 DT dizziness and the dizziness resolved.  06/12/2019 appointment, the following is noted:  Occ Ambien.  More often alprazolam 0.5 mg HS. Usually good sleep and rested. 7 hours Good except perimenopausal.  Irregular and heavy periods and can cause her to feel pretty crappy. Avoiding replacement of hormones.  Otherwise good mood most of the time.  Not depressed since here but can feel agitated and bloated. No GI sx since reducing lamotrigine to current dose. Plan no significant med changes.  02/05/2020 appointment with the following noted: Son 65 yo homeschooling since  Robin Daniel. Mood "goodish".  Covid last month.  Recovered well except put me in a slump more depressed.  With Covid had HA and nausea and altered taste.   More depressed and ruminating lately too. Also stress medical problems.  Wearing me down.  Anxiety around repeated breast issues.      Plan: No med changes  07/27/2020 appointment with the following noted: Robin Daniel is 47 yo.   Alternates between alprazolam and Ambien at night to reduce tolerance.  That seems to work. Mood has been good. No complaints about meds.  Cycle is less than in past but only once every few mos.  Still gets some PMS sx.  Irrational thoughts 7 days per month and feels like a failure as mother, wife, irritable.  H notices it. Needs new therapist. Patient denies difficulty with sleep initiation or maintenance. Still struggles with chronic anxiety and negative thoughts about herself and lately health related.  Denies appetite disturbance.  Patient reports that energy and motivation have been good.  Patient denies any difficulty with concentration.  Patient denies any suicidal ideation. Also is irritalble and anxious with period.    01/26/2021 appt noted: Good for the most part.  Covid early December and prednisone for a month then menstrual c ycle.  Don't feel as well.  Struggling more with fear and death thoughts.  Yesterday really tired.  Appetite on and off.  Mind goes straight to a dark place with worry and spirals. Not felt well for a couple of weeks and hates it.  Worries over it.  Fearful of  medical evaluations and sx.  Normal mammogram.  Chronic medical worry.  How am I going to handle it if something happens for real.  03/04/2021 appt noted:  with H This week seemed to need the sertraline longer.  Become OC about health things.  Started getting  compulsive seeing doctor.  7 days/month was not enough.  Exaggerated somatic fears.  Couldn't reason and was freaking out.  Does see benefit with sertraline markedly.   H sees it as OC.   Seems ok on the other days but the bad days are intense. No mania.    04/27/2021 appointment with the following noted: Ambien works if rotates with Xanax for sleep.  Occ Xanax for anxiety. Today feels good but crappy the last week. Hormonally related mood problems and sees benefit with sdertraline usually.  When it happens gets unrealistic health obsessions and more depressed and hopeless but it passes.   Doesn't take more than 14 sertraline in a month but worries about taking it too much. Irregular cycles.  Sometimes doesn't feel in control of emotions.  Hard getting older and having trouble accepting this.   Never taking hormones. Worries about memory.  08/08/21 appt noted: Mood pretty steady.  Sertraline really helped a lot prn and working out better than scheduled. Pleased with meds. About 6 weeks ago decided to eat more and specifically more protein and weight trained for 6 weeks and feels better doing so. Taiking in from 45 g protein to over 100 G protein and feels better. Ok with sleep generally. Robin Daniel 47 yo wants to RT school instead of home school and going to private school. Going to school church. Plan: no med changes  12/12/21 appt noted: Satisfied with meds.  Around period is afraid of bad things happening, accidents, illness and some when not hormonal. Thinks of death too much.  Has fantastic life and shouldn't be so afraid.  Good support.  Great life and H.  Takes sertraline prn for a week per month and as needed.   Wants a Veterinary surgeon and needs someone in [place].   Yoga daily.  But neck pain is a problem. Not depressed. Tolerating meds.  Plan no med changes Patient's insurance would not pay for Tegretol XR 100 mg tablets 3 in the morning and 4 at night because of quantity limits.   Therefore the prescription was rewritten Tegretol-XR 100 mg tablets 1 in the morning #30 and 1 refill and Tegretol-XR 200 mg tablets 1 in the morning and 2 at night #90 and 1 refill.  she is  also to take short acting carbamazepine 100 mg tablets 2 at night. Continue lamotrigine 100 mg at noon and 200 mg at night for bipolar depression Continue the Wellbutrin SR 150 mg in the morning as she feels that is helpful for energy and appetite control.  And somewhat for attention and focus. Continue alternating Xanax and Ambien at night but is also taking it prn for anxiety.  I have okayed number 45/month Sertraline 50 mg prn PMDD.  Use prn has worked.  07/12/22 appt noted: Still doing well with meds and consistent with meds as above.  No SE Patient reports stable mood and denies depressed or irritable moods. Except still some PMDD with some benefit with sertraline prn.  Patient denies difficulty with sleep initiation or maintenance with Xanax generally better than zolpidem. Denies appetite disturbance.  Patient reports that energy and motivation have been good.  Patient denies any difficulty with concentration.  Patient denies any suicidal  ideation. No new concerns and health I sgood.  No med changes.  Past Psychiatric Medication Trials: Carbamazepine 900, lamotrigine XR 200 mg twice daily, Wellbutrin SR 100 mg twice daily,  naltrexone, Abilify,  Trintellix, paroxetine weakness,  sertraline, other antidepressants stimulants caused mania,  alprazolam, Ambien,  N-acetylcysteine,  topiramate,   She also has had a good response to the full dose of Equetro.  Review of Systems: No tremor no weakness no nausea vomiting. No dizziness. Less Fatigue lately. Definite residual anxiety Problems with pain in teeth.  Good checkup.  Neck issues and chiropracter.  Medications: I have reviewed the patient's current medications.  Current Outpatient Medications  Medication Sig Dispense Refill   bimatoprost (LATISSE) 0.03 % ophthalmic solution PLACE 1 DROP ON APPLICATOR & APPLY EVENLY AT THE BASE OF EYELASHES NIGHTLY ON BOTH EYES.     buPROPion (WELLBUTRIN SR) 150 MG 12 hr tablet Take 1 tablet (150 mg  total) by mouth daily. 90 tablet 1   hydrocortisone 2.5 % cream Apply topically 2 (two) times daily as needed (Rash). Use for up to 1 week. Apply to eyelids for rash 3.5 g 0   levothyroxine (SYNTHROID, LEVOTHROID) 50 MCG tablet Take 1 tablet by mouth every morning.  5   Tretinoin (ALTRENO) 0.05 % LOTN Apply 1 Application topically at bedtime. Qhs to face as tolerated 45 g 11   zolpidem (AMBIEN) 10 MG tablet Take 0.5-1 tablets (5-10 mg total) by mouth at bedtime as needed for sleep. 30 tablet 0   ALPRAZolam (XANAX) 1 MG tablet Take 1 tablet (1 mg total) by mouth 2 (two) times daily as needed for anxiety. 45 tablet 5   carbamazepine (TEGRETOL XR) 100 MG 12 hr tablet Take 1 tablet (100 mg total) by mouth every morning. 90 tablet 1   carbamazepine (TEGRETOL XR) 200 MG 12 hr tablet TAKE ONE TABLET BY MOUTH EVERY MORNING TAKE TWO TABLETS BY MOUTH AT BEDTIME 90 tablet 1   carbamazepine (TEGRETOL) 100 MG chewable tablet Chew 2 tablets (200 mg total) by mouth at bedtime. 180 tablet 1   lamoTRIgine (LAMICTAL) 200 MG tablet TAKE 1/2 TABLET BY MOUTH AT LUNCH AND 1 TABLET BY MOUTH AT NIGHT 135 tablet 1   sertraline (ZOLOFT) 50 MG tablet Take 1 tablet (50 mg total) by mouth daily. 90 tablet 1   No current facility-administered medications for this visit.    Medication Side Effects: None no longer dizziness, nausea  Allergies: No Known Allergies  Past Medical History:  Diagnosis Date   Bipolar disorder (HCC)    Hypothyroidism    Melanoma (HCC)    Right back. 2021. Breslow's 1.22mm, Clark's level IV, mitotic index 6/mm2    Family History  Problem Relation Age of Onset   Anxiety disorder Father    Breast cancer Neg Hx     Social History   Socioeconomic History   Marital status: Married    Spouse name: Not on file   Number of children: Not on file   Years of education: Not on file   Highest education level: Not on file  Occupational History   Not on file  Tobacco Use   Smoking status: Never    Smokeless tobacco: Never  Substance and Sexual Activity   Alcohol use: No   Drug use: No   Sexual activity: Not on file  Other Topics Concern   Not on file  Social History Narrative   Not on file   Social Determinants of Health   Financial Resource  Strain: Not on file  Food Insecurity: Not on file  Transportation Needs: Not on file  Physical Activity: Not on file  Stress: Not on file  Social Connections: Not on file  Intimate Partner Violence: Not on file    Past Medical History, Surgical history, Social history, and Family history were reviewed and updated as appropriate.   Please see review of systems for further details on the patient's review from today.   Objective:   Physical Exam:  There were no vitals taken for this visit.  Physical Exam Constitutional:      General: She is not in acute distress.    Appearance: She is well-developed.  Musculoskeletal:        General: No deformity.  Neurological:     Mental Status: She is alert and oriented to person, place, and time.     Cranial Nerves: No dysarthria.     Coordination: Coordination normal.  Psychiatric:        Attention and Perception: Attention normal. She does not perceive auditory hallucinations.        Mood and Affect: Mood is anxious. Mood is not depressed. Affect is not labile, angry, tearful or inappropriate.        Speech: Speech normal.        Behavior: Behavior normal. Behavior is cooperative.        Thought Content: Thought content normal. Thought content is not paranoid or delusional. Thought content does not include homicidal or suicidal ideation. Thought content does not include suicidal plan.        Cognition and Memory: Cognition and memory normal.        Judgment: Judgment normal.     Comments: Insight good.   Episodic health obsessions      Lab Review:  No results found for: "NA", "K", "CL", "CO2", "GLUCOSE", "BUN", "CREATININE", "CALCIUM", "PROT", "ALBUMIN", "AST", "ALT", "ALKPHOS",  "BILITOT", "GFRNONAA", "GFRAA"     Component Value Date/Time   WBC 16.2 (H) 10/29/2007 0505   RBC 2.70 (L) 10/29/2007 0505   HGB 8.7 DELTA CHECK NOTED (L) 10/29/2007 0505   HCT 25.9 (L) 10/29/2007 0505   PLT 183 10/29/2007 0505   MCV 95.8 10/29/2007 0505   MCHC 33.6 10/29/2007 0505   RDW 14.8 10/29/2007 0505    No results found for: "POCLITH", "LITHIUM"   Lab Results  Component Value Date   CBMZ 11.1 11/09/2017     .res Assessment: Plan:    Careena was seen today for follow-up.  Diagnoses and all orders for this visit:  Generalized anxiety disorder -     ALPRAZolam (XANAX) 1 MG tablet; Take 1 tablet (1 mg total) by mouth 2 (two) times daily as needed for anxiety.  Bipolar II disorder (HCC) -     carbamazepine (TEGRETOL) 100 MG chewable tablet; Chew 2 tablets (200 mg total) by mouth at bedtime. -     carbamazepine (TEGRETOL XR) 100 MG 12 hr tablet; Take 1 tablet (100 mg total) by mouth every morning. -     carbamazepine (TEGRETOL XR) 200 MG 12 hr tablet; TAKE ONE TABLET BY MOUTH EVERY MORNING TAKE TWO TABLETS BY MOUTH AT BEDTIME -     lamoTRIgine (LAMICTAL) 200 MG tablet; TAKE 1/2 TABLET BY MOUTH AT LUNCH AND 1 TABLET BY MOUTH AT NIGHT  PMDD (premenstrual dysphoric disorder) -     sertraline (ZOLOFT) 50 MG tablet; Take 1 tablet (50 mg total) by mouth daily.  Insomnia due to mental condition     We  discussed Patient was stable on 900 mg of Equetro for an extended period of time.  However she eventually started developing side effects of dizziness and nausea and vomiting and could no longer tolerate that dosage.  The dosage was cut back to 600 mg and she has subsequently developed a mixture of obsessive-compulsive and irritable hypomanic symptoms.  She was having unusual intrusive obsessive thoughts which were about lice and then about Covid.  She had been successful at transitioning from [facility] to a mixture of immediate release and extended release carbamazepine.  She was  tolerating the 900 mg daily which had markedly reduced the intrusive obsessive thoughts about Covid and other obsessive anxious thoughts.  It had helped to reduce the dysphoric mixed mood symptoms.    Sx with anxiety partially controlled  including PMDD but not ideal.  Some benefit with prn sertraline.  Patient's insurance would not pay for Tegretol XR 100 mg tablets 3 in the morning and 4 at night because of quantity limits.   Therefore the prescription was rewritten Tegretol-XR 100 mg tablets 1 in the morning #30 and 1 refill and Tegretol-XR 200 mg tablets 1 in the morning and 2 at night #90 and 1 refill.  she is also to take short acting carbamazepine 100 mg tablets 2 at night.  Continue lamotrigine 100 mg at noon and 200 mg at night for bipolar depression  Continue the Wellbutrin SR 150 mg in the morning as she feels that is helpful for energy and appetite control.  And somewhat for attention and focus.  Continue alternating Xanax and Ambien at night but is also taking it prn for anxiety.  I have okayed number 45/month  Sertraline 50 mg prn PMDD.  Use prn has worked.  No med changes  She is taking an unusual mix between long-acting and short acting carbamazepine in order to achieve maximum mood benefit but avoid side effects of nausea and dizziness without this combination and also dictated by insurance limitations.  Disc immune response and depression probably explains recent increased depression.  Misses some of the lamotrigine.  We discussed the short-term risks associated with benzodiazepines including sedation and increased fall risk among others.  Discussed long-term side effect risk including dependence, potential withdrawal symptoms, and the potential eventual dose-related risk of dementia.  But recent studies from 2020 dispute this association between benzodiazepines and dementia risk. Newer studies in 2020 do not support an association with dementia.  FU 6 mos  Meredith Staggers,  MD, DFAPA   Please see After Visit Summary for patient specific instructions.   Future Appointments  Date Time Provider Department Center  11/21/2022  8:45 AM Willeen Niece, MD [facility] None    No orders of the defined types were placed in this encounter.      -------------------------------

## 2022-07-16 ENCOUNTER — Other Ambulatory Visit: Payer: Self-pay | Admitting: Psychiatry

## 2022-07-16 DIAGNOSIS — F3181 Bipolar II disorder: Secondary | ICD-10-CM

## 2022-07-19 ENCOUNTER — Other Ambulatory Visit: Payer: Self-pay | Admitting: Psychiatry

## 2022-07-19 DIAGNOSIS — F3181 Bipolar II disorder: Secondary | ICD-10-CM

## 2022-08-15 ENCOUNTER — Other Ambulatory Visit: Payer: Self-pay | Admitting: Psychiatry

## 2022-08-15 DIAGNOSIS — F3181 Bipolar II disorder: Secondary | ICD-10-CM

## 2022-09-14 ENCOUNTER — Other Ambulatory Visit: Payer: Self-pay | Admitting: Psychiatry

## 2022-09-14 DIAGNOSIS — F3181 Bipolar II disorder: Secondary | ICD-10-CM

## 2022-11-02 ENCOUNTER — Telehealth: Payer: Self-pay | Admitting: Psychiatry

## 2022-11-02 NOTE — Telephone Encounter (Signed)
I relayed msg to pt. She wanted to know if her levels being "a little off" is related to the Tegretol and does she need to back off. She wants to know if it is normal for levels to be off with Tegretol.

## 2022-11-02 NOTE — Telephone Encounter (Signed)
Pt LVM @ 9:24a that she had blood work done recently.  The doctor thinks that some of her results could be effected by the Tegretol.  She wants Dr Jennelle Human to review her blood work results.  Next appt 1/20

## 2022-11-02 NOTE — Telephone Encounter (Signed)
I reviewed the Carbamazepine (Tegretol) level which was normal. CBC and metabolic panel were unremarkable and stable compared to those done over the last couple of years. I see no concerns re: things related to Tegretol.  If there are other questions let us know.

## 2022-11-08 ENCOUNTER — Other Ambulatory Visit: Payer: Self-pay | Admitting: Obstetrics and Gynecology

## 2022-11-08 DIAGNOSIS — Z1231 Encounter for screening mammogram for malignant neoplasm of breast: Secondary | ICD-10-CM

## 2022-11-18 ENCOUNTER — Other Ambulatory Visit: Payer: Self-pay | Admitting: Psychiatry

## 2022-11-18 DIAGNOSIS — F3181 Bipolar II disorder: Secondary | ICD-10-CM

## 2022-11-21 ENCOUNTER — Ambulatory Visit (INDEPENDENT_AMBULATORY_CARE_PROVIDER_SITE_OTHER): Payer: 59 | Admitting: Dermatology

## 2022-11-21 ENCOUNTER — Encounter: Payer: Self-pay | Admitting: Dermatology

## 2022-11-21 ENCOUNTER — Ambulatory Visit: Payer: 59 | Admitting: Dermatology

## 2022-11-21 DIAGNOSIS — L821 Other seborrheic keratosis: Secondary | ICD-10-CM

## 2022-11-21 DIAGNOSIS — W908XXA Exposure to other nonionizing radiation, initial encounter: Secondary | ICD-10-CM | POA: Diagnosis not present

## 2022-11-21 DIAGNOSIS — L814 Other melanin hyperpigmentation: Secondary | ICD-10-CM | POA: Diagnosis not present

## 2022-11-21 DIAGNOSIS — D229 Melanocytic nevi, unspecified: Secondary | ICD-10-CM

## 2022-11-21 DIAGNOSIS — D1801 Hemangioma of skin and subcutaneous tissue: Secondary | ICD-10-CM

## 2022-11-21 DIAGNOSIS — L578 Other skin changes due to chronic exposure to nonionizing radiation: Secondary | ICD-10-CM

## 2022-11-21 DIAGNOSIS — Z1283 Encounter for screening for malignant neoplasm of skin: Secondary | ICD-10-CM

## 2022-11-21 DIAGNOSIS — Z85820 Personal history of malignant melanoma of skin: Secondary | ICD-10-CM

## 2022-11-21 NOTE — Progress Notes (Signed)
Follow-Up Visit   Subjective  Robin Daniel is a 47 y.o. female who presents for the following: Skin Cancer Screening and Full Body Skin Exam  The patient presents for Total-Body Skin Exam (TBSE) for skin cancer screening and mole check. The patient has spots, moles and lesions to be evaluated, some may be new or changing and the patient may have concern these could be cancer.    The following portions of the chart were reviewed this encounter and updated as appropriate: medications, allergies, medical history  Review of Systems:  No other skin or systemic complaints except as noted in HPI or Assessment and Plan.  Objective  Well appearing patient in no apparent distress; mood and affect are within normal limits.  A full examination was performed including scalp, head, eyes, ears, nose, lips, neck, chest, axillae, abdomen, back, buttocks, bilateral upper extremities, bilateral lower extremities, hands, feet, fingers, toes, fingernails, and toenails. All findings within normal limits unless otherwise noted below.   Relevant physical exam findings are noted in the Assessment and Plan.    Assessment & Plan   SKIN CANCER SCREENING PERFORMED TODAY.  ACTINIC DAMAGE - Chronic condition, secondary to cumulative UV/sun exposure - diffuse scaly erythematous macules with underlying dyspigmentation - Recommend daily broad spectrum sunscreen SPF 30+ to sun-exposed areas, reapply every 2 hours as needed.  - Staying in the shade or wearing long sleeves, sun glasses (UVA+UVB protection) and wide brim hats (4-inch brim around the entire circumference of the hat) are also recommended for sun protection.  - Call for new or changing lesions.  LENTIGINES, SEBORRHEIC KERATOSES, HEMANGIOMAS - Benign normal skin lesions - Benign-appearing - Call for any changes  MELANOCYTIC NEVI - Tan-brown and/or pink-flesh-colored symmetric macules and papules - Benign appearing on exam today -  Observation - Call clinic for new or changing moles - Recommend daily use of broad spectrum spf 30+ sunscreen to sun-exposed areas.   HISTORY OF MELANOMA - No evidence of recurrence today - No lymphadenopathy - Recommend regular full body skin exams - Recommend daily broad spectrum sunscreen SPF 30+ to sun-exposed areas, reapply every 2 hours as needed.  - Call if any new or changing lesions are noted between office visits - patient was surprised when [facility] shared that patient had a history of melanoma. Patient stated that she was told by the surgeon who excised the melanoma that is was not a true melanoma and that the excision was "overkill". Patient states that the original biopsy was done with Dr Roseanne Kaufman at [facility]. First pathology report was lost. It was sent for a second opinion. - extensively discussed that pathology reports from biopsy in 2021 state the lesion was an invasive melanoma, and that wide local excision would be the minimum standard of care. Breslow of 1.4 mm would mean sentinel node biopsy should be considered. Patient states the surgeon at the time did not think sentinel node biopsy was necessary so patient jointly decided not to pursue it. Patient reports no systemic illnesses or symptoms suggestive of metastatic disease since then. - R back, excised 2021. Breslow's 1.58mm, Clark's level IV, mitotic index 6/mm2  (pathology reports obtained from [facility] and now in media tab dated 11/21/22) - sent message to Dr Luretha Murphy MD surgeon from 08/29/19 to clarify his understanding of this case - patient prefers to continue annual skin checks   Return in about 1 year (around 11/21/2023) for tbse.  I, Germaine Pomfret, CMA, am acting as scribe for  Elie Goody, MD.   Documentation: I have reviewed the above documentation for accuracy and completeness, and I agree with the above.  Elie Goody, MD

## 2022-11-28 ENCOUNTER — Telehealth: Payer: Self-pay | Admitting: Dermatology

## 2022-11-28 ENCOUNTER — Encounter: Payer: Self-pay | Admitting: Dermatology

## 2022-11-28 NOTE — Telephone Encounter (Signed)
-----   Message ----- From: Doristine Devoid [facility]) Sent: 11/28/2022   1:24 PM EST To: Elie Goody, MD [facility]) Subject: RE: Melanoma excision  Hello,  I just wanted to make you aware that Dr. Daphine Deutscher has retired.  I was his assistant and happen to be checking his in basket.  Thanks, Chemira     ----- Message ----- From: Elie Goody [facility]) Sent: 11/26/2022  11:26 AM EST To: Katha Cabal, MD [facility]) Subject: Melanoma excision  Hi Dr Daphine Deutscher,  I hope you've been well. Were you the surgeon who did the excision on the melanoma for this patient on 08/29/19? The patient's interpretation of the excision result with you was that she did not have melanoma. However, the first and second interpretations of the original biopsy (performed at another dermatologist - [facility] - now in the media tab dated 11/21/22) clearly favoured that the lesion represented an invasive melanoma. What do you remember from this case? The patient was upset that we said she had a past melanoma; she said the surgeon told her it was not melanoma and that the surgery was unnecessary. We are just trying to clarify so we know her medical history.  Thank you for any input, Herbert Deaner, [facility]

## 2022-12-06 ENCOUNTER — Encounter: Payer: Self-pay | Admitting: Psychiatry

## 2023-01-12 ENCOUNTER — Other Ambulatory Visit: Payer: Self-pay | Admitting: Psychiatry

## 2023-01-12 DIAGNOSIS — F3181 Bipolar II disorder: Secondary | ICD-10-CM

## 2023-01-18 ENCOUNTER — Other Ambulatory Visit: Payer: Self-pay | Admitting: Psychiatry

## 2023-01-18 DIAGNOSIS — F5105 Insomnia due to other mental disorder: Secondary | ICD-10-CM

## 2023-01-20 ENCOUNTER — Other Ambulatory Visit: Payer: Self-pay | Admitting: Psychiatry

## 2023-01-20 DIAGNOSIS — F3181 Bipolar II disorder: Secondary | ICD-10-CM

## 2023-01-24 ENCOUNTER — Other Ambulatory Visit: Payer: Self-pay | Admitting: Psychiatry

## 2023-01-24 DIAGNOSIS — F411 Generalized anxiety disorder: Secondary | ICD-10-CM

## 2023-01-25 NOTE — Telephone Encounter (Signed)
 LF 10/14 LV 06/19

## 2023-02-06 ENCOUNTER — Encounter: Payer: Self-pay | Admitting: Radiology

## 2023-02-06 ENCOUNTER — Ambulatory Visit
Admission: RE | Admit: 2023-02-06 | Discharge: 2023-02-06 | Disposition: A | Discharge status: Home / Self Care | Payer: BC Managed Care – PPO | Source: Ambulatory Visit | Attending: Obstetrics and Gynecology | Admitting: Obstetrics and Gynecology

## 2023-02-06 DIAGNOSIS — Z1231 Encounter for screening mammogram for malignant neoplasm of breast: Secondary | ICD-10-CM | POA: Diagnosis present

## 2023-02-07 ENCOUNTER — Ambulatory Visit (INDEPENDENT_AMBULATORY_CARE_PROVIDER_SITE_OTHER): Payer: BC Managed Care – PPO | Admitting: Psychiatry

## 2023-02-07 ENCOUNTER — Encounter: Payer: Self-pay | Admitting: Psychiatry

## 2023-02-07 DIAGNOSIS — F3181 Bipolar II disorder: Secondary | ICD-10-CM

## 2023-02-07 DIAGNOSIS — F422 Mixed obsessional thoughts and acts: Secondary | ICD-10-CM

## 2023-02-07 DIAGNOSIS — F411 Generalized anxiety disorder: Secondary | ICD-10-CM

## 2023-02-07 DIAGNOSIS — F3281 Premenstrual dysphoric disorder: Secondary | ICD-10-CM

## 2023-02-07 DIAGNOSIS — F5105 Insomnia due to other mental disorder: Secondary | ICD-10-CM | POA: Diagnosis not present

## 2023-02-07 MED ORDER — LAMOTRIGINE ER 300 MG PO TB24: 1.0000 | ORAL_TABLET | Freq: Every evening | ORAL | 0 refills | Status: DC

## 2023-02-07 MED ORDER — SERTRALINE HCL 50 MG PO TABS: 50.0000 mg | ORAL_TABLET | Freq: Every day | ORAL | 0 refills | Status: DC

## 2023-02-07 MED ORDER — RISPERIDONE 0.5 MG PO TABS: ORAL_TABLET | ORAL | 0 refills | Status: DC

## 2023-02-07 MED ORDER — BUPROPION HCL ER (SR) 150 MG PO TB12: 150.0000 mg | ORAL_TABLET | Freq: Every day | ORAL | 0 refills | Status: DC

## 2023-02-07 MED ORDER — CARBAMAZEPINE ER 200 MG PO TB12: ORAL_TABLET | ORAL | 0 refills | Status: DC

## 2023-02-07 MED ORDER — ZOLPIDEM TARTRATE 10 MG PO TABS: 5.0000 mg | ORAL_TABLET | Freq: Every evening | ORAL | 1 refills | Status: DC | PRN

## 2023-02-07 MED ORDER — CARBAMAZEPINE ER 100 MG PO TB12: 100.0000 mg | ORAL_TABLET | Freq: Every morning | ORAL | 1 refills | Status: DC

## 2023-02-07 MED ORDER — CARBAMAZEPINE 100 MG PO CHEW: 200.0000 mg | CHEWABLE_TABLET | Freq: Every day | ORAL | 1 refills | Status: DC

## 2023-02-07 NOTE — Progress Notes (Signed)
 Robin Daniel [id number] 08/18/1975 48 y.o.    Subjective:   Patient ID:  Robin Daniel is a 48 y.o. (DOB Jun 16, 1975) female.  Chief Complaint:  Chief Complaint  Patient presents with   Follow-up   Depression   Anxiety   Medication Reaction    Depression        Associated symptoms include no decreased concentration and no suicidal ideas.  Past medical history includes anxiety.   Anxiety Patient reports no decreased concentration or suicidal ideas.     Robin Daniel call yesterday for an urgent visit because of problems keeping her meds straight with resultant side effect issues.  Follow-up med changes and mood.  At visit Jun 06, 2018.  Insurance problems not covering Equetro  resulted in the patient having to switch to a mixture of Tegretol -XR 100 mg tablets, 200 mg Tegretol -XR tablets, and immediate release to 200 mg carbamazepine  at night this is resulted in some patient confusion about how to take her medicines and she has called a couple of times since her last appointment having mixed her medications up and having problems.  seen October 2020.  The following was noted and no meds were changed. Overall doing well.  No sig mood swings except PMDD.  Anxiety is manageable.  No major mood swings.  Trouble with getting enough fiber.  Taken Miralax for years.   Reduced lamotrigine  in August 2020 DT dizziness and the dizziness resolved.  06/12/2019 appointment, the following is noted:  Occ Ambien .  More often alprazolam  0.5 mg HS. Usually good sleep and rested. 7 hours Good except perimenopausal.  Irregular and heavy periods and can cause her to feel pretty crappy. Avoiding replacement of hormones.  Otherwise good mood most of the time.  Not depressed since here but can feel agitated and bloated. No GI sx since reducing lamotrigine  to current dose. Plan no significant med changes.  02/05/2020 appointment with the following noted: Son 2 yo homeschooling  since Robin Daniel. Mood "goodish".  Covid last month.  Recovered well except put me in a slump more depressed.  With Covid had HA and nausea and altered taste.   More depressed and ruminating lately too. Also stress medical problems.  Wearing me down.  Anxiety around repeated breast issues.      Plan: No med changes  07/27/2020 appointment with the following noted: Robin Daniel is 48 yo.   Alternates between alprazolam  and Ambien  at night to reduce tolerance.  That seems to work. Mood has been good. No complaints about meds.  Cycle is less than in past but only once every few mos.  Still gets some PMS sx.  Irrational thoughts 7 days per month and feels like a failure as mother, wife, irritable.  H notices it. Needs new therapist. Patient denies difficulty with sleep initiation or maintenance. Still struggles with chronic anxiety and negative thoughts about herself and lately health related.  Denies appetite disturbance.  Patient reports that energy and motivation have been good.  Patient denies any difficulty with concentration.  Patient denies any suicidal ideation. Also is irritalble and anxious with period.    01/26/2021 appt noted: Good for the most part.  Covid early December and prednisone for a month then menstrual c ycle.  Don't feel as well.  Struggling more with fear and death thoughts.  Yesterday really tired.  Appetite on and off.  Mind goes straight to a dark place with worry and spirals. Not felt well for a couple of weeks and hates it.  Worries over it.  Fearful of medical evaluations and sx.  Normal mammogram.  Chronic medical worry.  How am I going to handle it if something happens for real.  03/04/2021 appt noted:  with H This week seemed to need the sertraline  longer.  Become OC about health things.  Started getting  compulsive seeing doctor.  7 days/month was not enough.  Exaggerated somatic fears.  Couldn't reason and was freaking out.  Does see benefit with sertraline  markedly.   H sees it as  OC.  Seems ok on the other days but the bad days are intense. No mania.    04/27/2021 appointment with the following noted: Ambien  works if rotates with Xanax  for sleep.  Occ Xanax  for anxiety. Today feels good but crappy the last week. Hormonally related mood problems and sees benefit with sdertraline usually.  When it happens gets unrealistic health obsessions and more depressed and hopeless but it passes.   Doesn't take more than 14 sertraline  in a month but worries about taking it too much. Irregular cycles.  Sometimes doesn't feel in control of emotions.  Hard getting older and having trouble accepting this.   Never taking hormones. Worries about memory.  08/08/21 appt noted: Mood pretty steady.  Sertraline  really helped a lot prn and working out better than scheduled. Pleased with meds. About 6 weeks ago decided to eat more and specifically more protein and weight trained for 6 weeks and feels better doing so. Taiking in from 45 g protein to over 100 G protein and feels better. Ok with sleep generally. Robin Daniel 48 yo wants to RT school instead of home school and going to private school. Going to school church. Plan: no med changes  12/12/21 appt noted: Satisfied with meds.  Around period is afraid of bad things happening, accidents, illness and some when not hormonal. Thinks of death too much.  Has fantastic life and shouldn't be so afraid.  Good support.  Great life and H.  Takes sertraline  prn for a week per month and as needed.   Wants a Veterinary surgeon and needs someone in [place].   Yoga daily.  But neck pain is a problem. Not depressed. Tolerating meds.  Plan no med changes Patient's insurance would not pay for Tegretol  XR 100 mg tablets 3 in the morning and 4 at night because of quantity limits.   Therefore the prescription was rewritten Tegretol -XR 100 mg tablets 1 in the morning #30 and 1 refill and Tegretol -XR 200 mg tablets 1 in the morning and 2 at night #90 and 1 refill.  she  is also to take short acting carbamazepine  100 mg tablets 2 at night. Continue lamotrigine  100 mg at noon and 200 mg at night for bipolar depression Continue the Wellbutrin  SR 150 mg in the morning as she feels that is helpful for energy and appetite control.  And somewhat for attention and focus. Continue alternating Xanax  and Ambien  at night but is also taking it prn for anxiety.  I have okayed number 45/month Sertraline  50 mg prn PMDD.  Use prn has worked.  07/12/22 appt noted: Still doing well with meds and consistent with meds as above.  No SE Patient reports stable mood and denies depressed or irritable moods. Except still some PMDD with some benefit with sertraline  prn.  Patient denies difficulty with sleep initiation or maintenance with Xanax  generally better than zolpidem . Denies appetite disturbance.  Patient reports that energy and motivation have been good.  Patient denies any difficulty with  concentration.  Patient denies any suicidal ideation. No new concerns and health I sgood.  No med changes.  02/07/23 appt noted: Pscyh meds:  Wellbutrin  SR 150 AM ,  Tegretol -XR 100 mg tablets 1 in the morning #30 and 1 refill and Tegretol -XR 200 mg tablets 1 in the morning and 2 at night #90 and 1 refill.  she is also to take short acting carbamazepine  100 mg tablets 2 at night. Continue lamotrigine  100 mg at noon and 200 mg at night for bipolar depression.   Sertraline  50 mg prn PMDD avg 5 days/month Can't get lamotrigine  close together otherwise dizzy and falls and Nausea, vomiting, falling.  Sometimes skips doses bc fear about getting it close together.   Mood struggling a lot during the Xmas.  Really awful.  Paranoid and obsessive.  No t suicidal but desperate and didn't want to live that way.    No longer hot flashes but still notices hormonal mood changes for a month.   Rough time during holidays for at least a couple of weeks.   Trying to pray over these things.   Talked to sister and it  seemed to help the paranoia and other obsessive thoughts.  Consistent with CBZ dosing. No SI since here but despair and thinking she would never get better.       Past Psychiatric Medication Trials: Carbamazepine  900, lamotrigine  XR 200 mg twice daily, Wellbutrin  SR 100 mg twice daily,  naltrexone, Abilify,  Trintellix, paroxetine weakness,  sertraline , other antidepressants stimulants caused mania,  alprazolam , Ambien ,  N-acetylcysteine,  topiramate,   She also has had a good response to the full dose of Equetro .  Review of Systems: No tremor no weakness no nausea vomiting. No dizziness. Less Fatigue lately.  Problems with pain in teeth.  Good checkup.  Neck issues and chiropracter. No new health problems.  Medications: I have reviewed the patient's current medications.  Current Outpatient Medications  Medication Sig Dispense Refill   ALPRAZolam  (XANAX ) 1 MG tablet TAKE ONE TABLET BY MOUTH TWICE A DAY AS NEEDED FOR ANXIETY 45 tablet 0   bimatoprost (LATISSE) 0.03 % ophthalmic solution PLACE 1 DROP ON APPLICATOR & APPLY EVENLY AT THE BASE OF EYELASHES NIGHTLY ON BOTH EYES.     hydrocortisone  2.5 % cream Apply topically 2 (two) times daily as needed (Rash). Use for up to 1 week. Apply to eyelids for rash 3.5 g 0   LamoTRIgine  300 MG TB24 24 hour tablet Take 1 tablet (300 mg total) by mouth at bedtime. 90 tablet 0   levothyroxine (SYNTHROID, LEVOTHROID) 50 MCG tablet Take 1 tablet by mouth every morning.  5   risperiDONE  (RISPERDAL ) 0.5 MG tablet 1 daily as needed for severe anxiety 30 tablet 0   Tretinoin  (ALTRENO ) 0.05 % LOTN Apply 1 Application topically at bedtime. Qhs to face as tolerated 45 g 11   buPROPion  (WELLBUTRIN  SR) 150 MG 12 hr tablet Take 1 tablet (150 mg total) by mouth daily. 90 tablet 0   carbamazepine  (TEGRETOL  XR) 100 MG 12 hr tablet Take 1 tablet (100 mg total) by mouth every morning. 90 tablet 1   carbamazepine  (TEGRETOL  XR) 200 MG 12 hr tablet TAKE 1 TABLET BY  MOUTH EVERY MORNING AND TAKE TWO TABLETS BY MOUTH EVERY EVENING 90 tablet 0   carbamazepine  (TEGRETOL ) 100 MG chewable tablet Chew 2 tablets (200 mg total) by mouth at bedtime. 180 tablet 1   sertraline  (ZOLOFT ) 50 MG tablet Take 1 tablet (50 mg total) by  mouth daily. 90 tablet 0   zolpidem  (AMBIEN ) 10 MG tablet Take 0.5-1 tablets (5-10 mg total) by mouth at bedtime as needed. for sleep 30 tablet 1   No current facility-administered medications for this visit.    Medication Side Effects: None no longer dizziness, nausea  Allergies: No Known Allergies  Past Medical History:  Diagnosis Date   Bipolar disorder (HCC)    Hypothyroidism    Melanoma (HCC)    Right back. 2021. Breslow's 1.67mm, Clark's level IV, mitotic index 6/mm2    Family History  Problem Relation Age of Onset   Anxiety disorder Father    Breast cancer Neg Hx     Social History   Socioeconomic History   Marital status: Married    Spouse name: Not on file   Number of children: Not on file   Years of education: Not on file   Highest education level: Not on file  Occupational History   Not on file  Tobacco Use   Smoking status: Never   Smokeless tobacco: Never  Substance and Sexual Activity   Alcohol use: No   Drug use: No   Sexual activity: Not on file  Other Topics Concern   Not on file  Social History Narrative   Not on file   Social Drivers of Health   Financial Resource Strain: Low Risk  (11/01/2022)   Received from [facility]   Overall Financial Resource Strain (CARDIA)    Difficulty of Paying Living Expenses: Not hard at all  Food Insecurity: No Food Insecurity (11/01/2022)   Received from [facility]   Hunger Vital Sign    Worried About Running Out of Food in the Last Year: Never true    Ran Out of Food in the Last Year: Never true  Transportation Needs: No Transportation Needs (11/01/2022)   [facility] -  Transportation    In the past 12 months, has lack of transportation kept you from medical appointments or from getting medications?: No    Lack of Transportation (Non-Medical): No  Physical Activity: Not on file  Stress: Not on file  Social Connections: Not on file  Intimate Partner Violence: Not on file    Past Medical History, Surgical history, Social history, and Family history were reviewed and updated as appropriate.   Please see review of systems for further details on the patient's review from today.   Objective:   Physical Exam:  There were no vitals taken for this visit.  Physical Exam Constitutional:      General: She is not in acute distress.    Appearance: She is well-developed.  Musculoskeletal:        General: No deformity.  Neurological:     Mental Status: She is alert and oriented to person, place, and time.     Cranial Nerves: No dysarthria.     Coordination: Coordination normal.  Psychiatric:        Attention and Perception: Attention normal. She does not perceive auditory hallucinations.        Mood and Affect: Mood is anxious. Mood is not depressed. Affect is not labile, angry, tearful or inappropriate.        Speech: Speech normal.        Behavior: Behavior normal. Behavior is cooperative.        Thought Content: Thought content normal. Thought content is not paranoid or delusional. Thought content does not include homicidal or suicidal ideation.  Thought content does not include suicidal plan.        Cognition and Memory: Cognition and memory normal.        Judgment: Judgment normal.     Comments: Insight good.   Episodic health obsessions      Lab Review:  No results found for: "NA", "K", "CL", "CO2", "GLUCOSE", "BUN", "CREATININE", "CALCIUM", "PROT", "ALBUMIN", "AST", "ALT", "ALKPHOS", "BILITOT", "GFRNONAA", "GFRAA"     Component Value Date/Time   WBC 16.2 (H) 10/29/2007 0505   RBC 2.70 (L) 10/29/2007 0505   HGB 8.7 DELTA CHECK NOTED (L)  10/29/2007 0505   HCT 25.9 (L) 10/29/2007 0505   PLT 183 10/29/2007 0505   MCV 95.8 10/29/2007 0505   MCHC 33.6 10/29/2007 0505   RDW 14.8 10/29/2007 0505    No results found for: "POCLITH", "LITHIUM"   Lab Results  Component Value Date   CBMZ 11.1 11/09/2017   CBMZ 10/24/22 10.3  .res Assessment: Plan:    Robin Daniel was seen today for follow-up, depression, anxiety and medication reaction.  Diagnoses and all orders for this visit:  Bipolar II disorder (HCC) -     LamoTRIgine  300 MG TB24 24 hour tablet; Take 1 tablet (300 mg total) by mouth at bedtime. -     risperiDONE  (RISPERDAL ) 0.5 MG tablet; 1 daily as needed for severe anxiety -     buPROPion  (WELLBUTRIN  SR) 150 MG 12 hr tablet; Take 1 tablet (150 mg total) by mouth daily. -     carbamazepine  (TEGRETOL ) 100 MG chewable tablet; Chew 2 tablets (200 mg total) by mouth at bedtime. -     carbamazepine  (TEGRETOL  XR) 200 MG 12 hr tablet; TAKE 1 TABLET BY MOUTH EVERY MORNING AND TAKE TWO TABLETS BY MOUTH EVERY EVENING -     carbamazepine  (TEGRETOL  XR) 100 MG 12 hr tablet; Take 1 tablet (100 mg total) by mouth every morning.  PMDD (premenstrual dysphoric disorder) -     risperiDONE  (RISPERDAL ) 0.5 MG tablet; 1 daily as needed for severe anxiety -     sertraline  (ZOLOFT ) 50 MG tablet; Take 1 tablet (50 mg total) by mouth daily.  Generalized anxiety disorder  Insomnia due to mental condition -     zolpidem  (AMBIEN ) 10 MG tablet; Take 0.5-1 tablets (5-10 mg total) by mouth at bedtime as needed. for sleep  OCD - safety and infestation focused -     risperiDONE  (RISPERDAL ) 0.5 MG tablet; 1 daily as needed for severe anxiety     50 min urgent appt We discussed Patient was stable on 900 mg of Equetro  for an extended period of time.  However she eventually started developing side effects of dizziness and nausea and vomiting and could no longer tolerate that dosage.  The dosage was cut back to 600 mg and she has subsequently developed a  mixture of obsessive-compulsive and irritable hypomanic symptoms.  She was having unusual intrusive obsessive thoughts which were about lice and then about Covid.  She had been successful at transitioning from Equetro  to a mixture of immediate release and extended release carbamazepine .  She was tolerating the 900 mg daily which had markedly reduced the intrusive obsessive thoughts about Covid and other obsessive anxious thoughts.  It had helped to reduce the dysphoric mixed mood symptoms.    Sx with anxiety poorly  controlled in December and sx were severe. including PMDD.  Some benefit with prn sertraline .  Disc  CBMZ 10/24/22 10.3.  and it's meaning and significance.  No indication to  change dosing.    Patient's insurance would not pay for Tegretol  XR 100 mg tablets 3 in the morning and 4 at night because of quantity limits.   Therefore the prescription was rewritten Tegretol -XR 100 mg tablets 1 in the morning #30 and 1 refill and Tegretol -XR 200 mg tablets 1 in the morning and 2 at night #90 and 1 refill.  she is also to take short acting carbamazepine  100 mg tablets 2 at night.  Switch lamotrigine  to ER 300 mg at night for bipolar depression bc marked SE taking IR with dizziness, NV  Continue the Wellbutrin  SR 150 mg in the morning as she feels that is helpful for energy and appetite control.  And somewhat for attention and focus.  Continue alternating Xanax  and Ambien  at night but is also taking it prn for anxiety.  I have okayed number 45/month  Sertraline  50 mg prn PMDD.  Use prn has partiallly worked usually Add risperidone  0.5 mg prn paranoia and obsessive thinking hormonally to use prn.  Needs more relief.  Disc alternative of HRT for emotional hormonal swings that are severe but she is fearful of health things and specifically cancer.  Though this could provide substantial emotional benefit.  She is taking an unusual mix between long-acting and short acting carbamazepine  in order to  achieve maximum mood benefit but avoid side effects of nausea and dizziness without this combination and also dictated by insurance limitations.  Disc immune response and depression probably explains recent increased depression.  Misses some of the lamotrigine .  We discussed the short-term risks associated with benzodiazepines including sedation and increased fall risk among others.  Discussed long-term side effect risk including dependence, potential withdrawal symptoms, and the potential eventual dose-related risk of dementia.  But recent studies from 2020 dispute this association between benzodiazepines and dementia risk. Newer studies in 2020 do not support an association with dementia.  FU 2 mos  Nori Beat, MD, DFAPA   Please see After Visit Summary for patient specific instructions.   Future Appointments  Date Time Provider Department Center  02/12/2023  9:00 AM Cottle, Kennedy Peabody., MD [facility] None  11/22/2023  8:45 AM Harris Liming, MD [facility] None    No orders of the defined types were placed in this encounter.      -------------------------------

## 2023-02-12 ENCOUNTER — Ambulatory Visit: Payer: 59 | Admitting: Psychiatry

## 2023-03-08 ENCOUNTER — Other Ambulatory Visit: Payer: Self-pay | Admitting: Psychiatry

## 2023-03-08 DIAGNOSIS — F422 Mixed obsessional thoughts and acts: Secondary | ICD-10-CM

## 2023-03-08 DIAGNOSIS — F3181 Bipolar II disorder: Secondary | ICD-10-CM

## 2023-03-08 DIAGNOSIS — F3281 Premenstrual dysphoric disorder: Secondary | ICD-10-CM

## 2023-03-13 ENCOUNTER — Other Ambulatory Visit: Payer: Self-pay | Admitting: Psychiatry

## 2023-03-13 DIAGNOSIS — F411 Generalized anxiety disorder: Secondary | ICD-10-CM

## 2023-03-13 NOTE — Telephone Encounter (Signed)
 Lf 1/2 lv 1/15

## 2023-03-22 ENCOUNTER — Other Ambulatory Visit: Payer: Self-pay | Admitting: Psychiatry

## 2023-03-22 DIAGNOSIS — F3181 Bipolar II disorder: Secondary | ICD-10-CM

## 2023-04-02 ENCOUNTER — Telehealth: Payer: Self-pay | Admitting: Psychiatry

## 2023-04-02 NOTE — Telephone Encounter (Signed)
 ok

## 2023-04-02 NOTE — Telephone Encounter (Signed)
 FYI Robin Daniel called at 4:30 to cancel her appt 3/19 because she doesn't think she needs the appt.  The change in the Lamictal helped tremendously and she is doing much better.  She RS to 6/4.

## 2023-04-02 NOTE — Telephone Encounter (Signed)
FYI - Please see message from patient.  

## 2023-04-07 ENCOUNTER — Other Ambulatory Visit: Payer: Self-pay | Admitting: Psychiatry

## 2023-04-07 DIAGNOSIS — F3281 Premenstrual dysphoric disorder: Secondary | ICD-10-CM

## 2023-04-07 DIAGNOSIS — F422 Mixed obsessional thoughts and acts: Secondary | ICD-10-CM

## 2023-04-07 DIAGNOSIS — F3181 Bipolar II disorder: Secondary | ICD-10-CM

## 2023-04-11 ENCOUNTER — Ambulatory Visit: Payer: BC Managed Care – PPO | Admitting: Psychiatry

## 2023-04-16 IMAGING — MG MM DIGITAL SCREENING BILAT W/ TOMO AND CAD
8 series · 9 of 24 positions shown · non-contrast
Comparison: Previous exam(s).

CLINICAL DATA: Screening.

EXAM:
DIGITAL SCREENING BILATERAL MAMMOGRAM WITH TOMOSYNTHESIS AND CAD
TECHNIQUE: Bilateral screening digital craniocaudal and mediolateral oblique
mammograms were obtained. Bilateral screening digital breast
tomosynthesis was performed. The images were evaluated with
computer-aided detection.

[L MLO synth-2D]
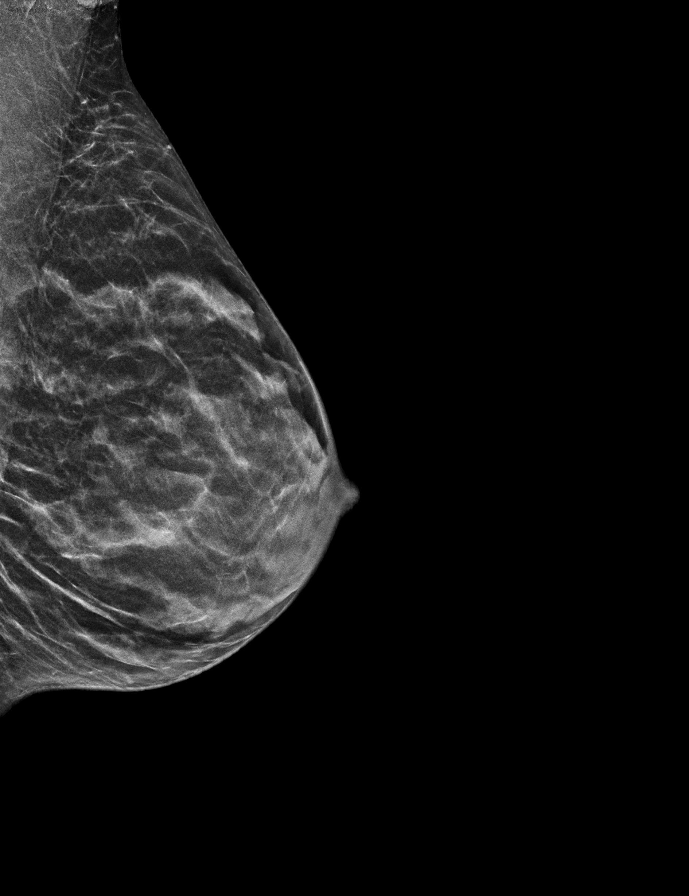

[L CC synth-2D]
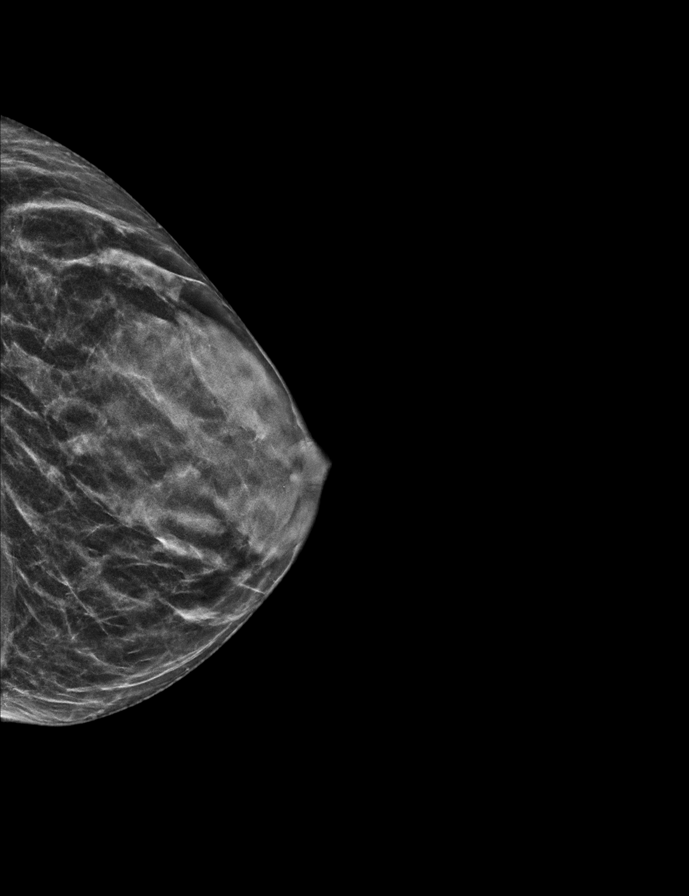

[R MLO synth-2D]
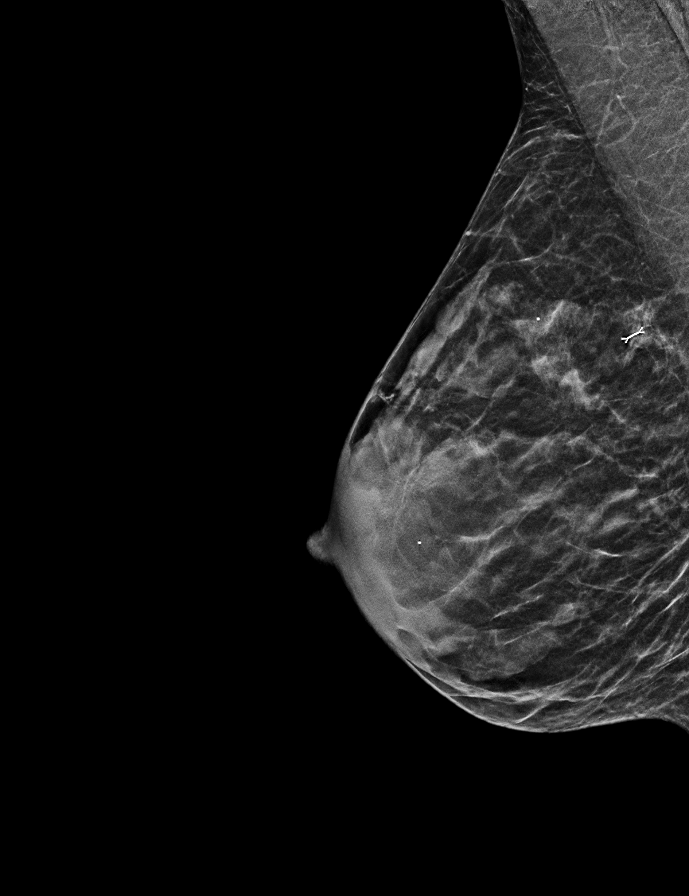

[R CC synth-2D]
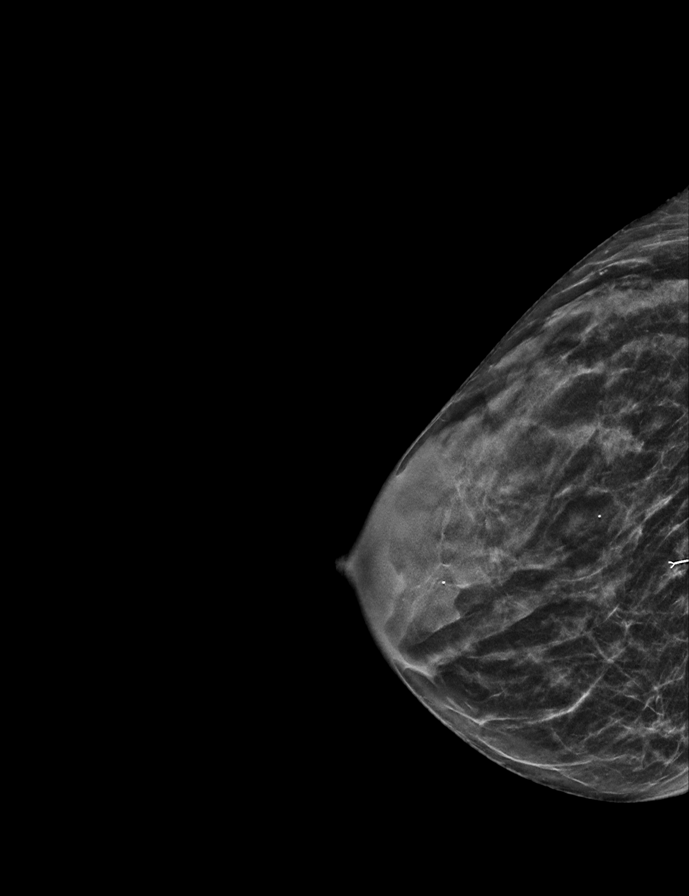

[L MLO tomo · 2 of 38 frames shown]
[frame 13/38]
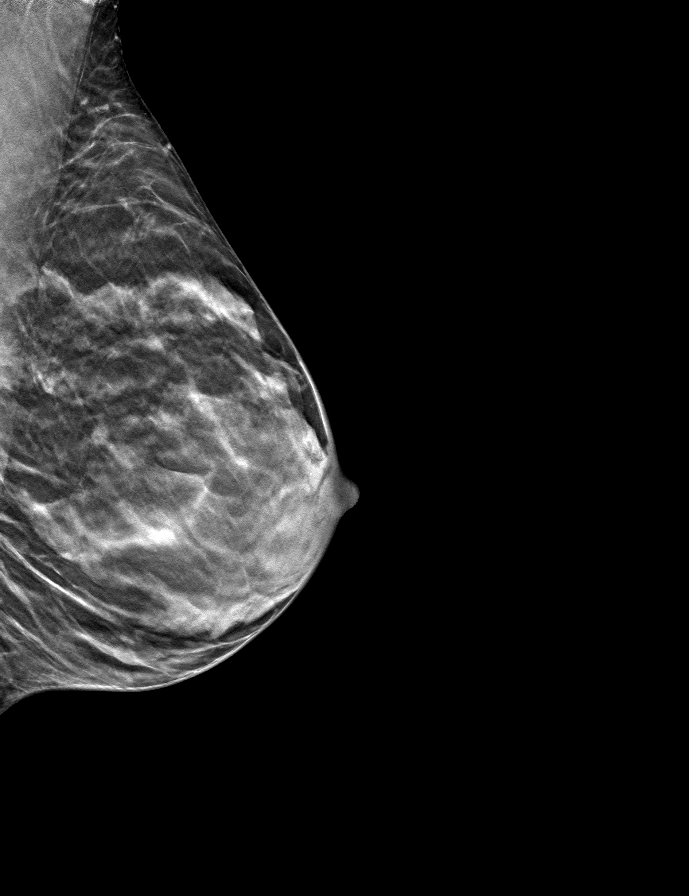
[frame 19/38]
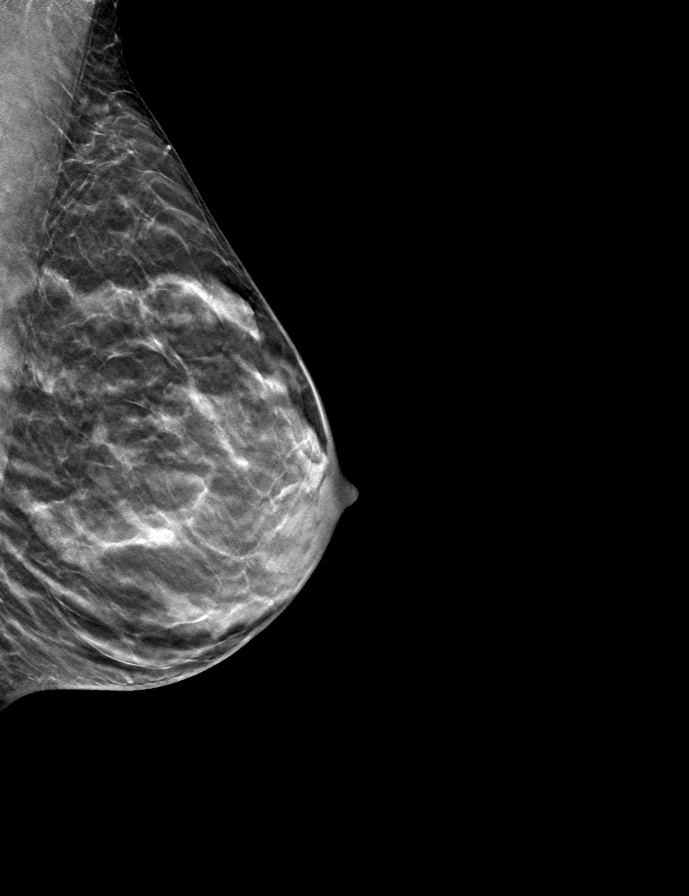

[L CC tomo · tomo slice 21/42.0]
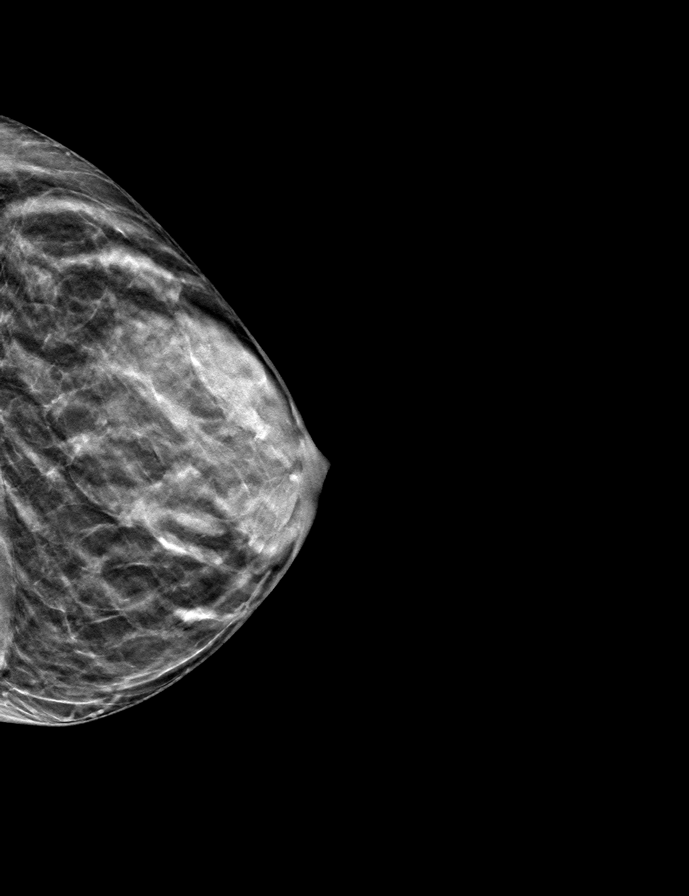

[R MLO tomo · tomo slice 17/32.0]
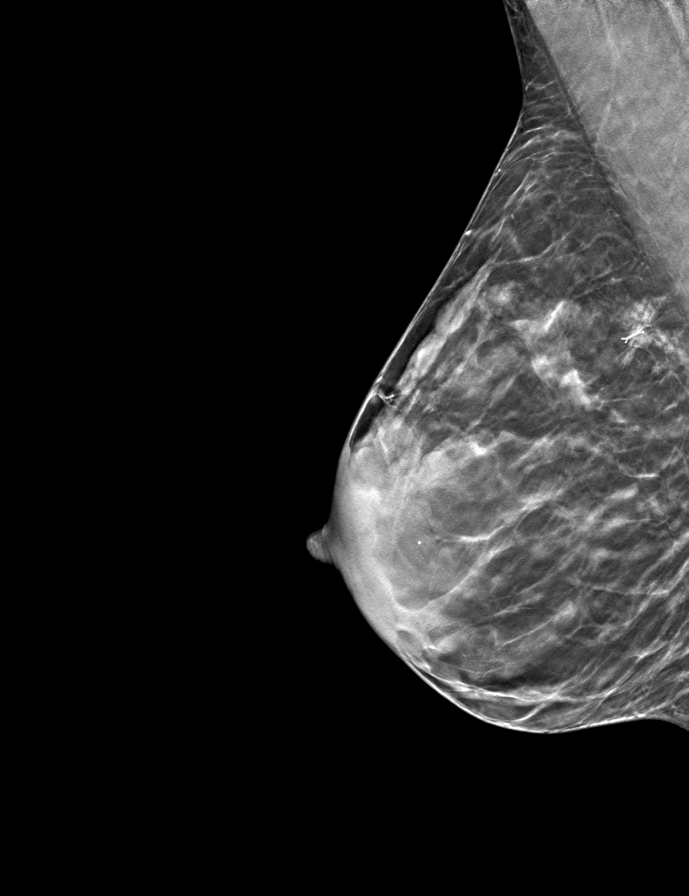

[R CC tomo · tomo slice 22/43.0]
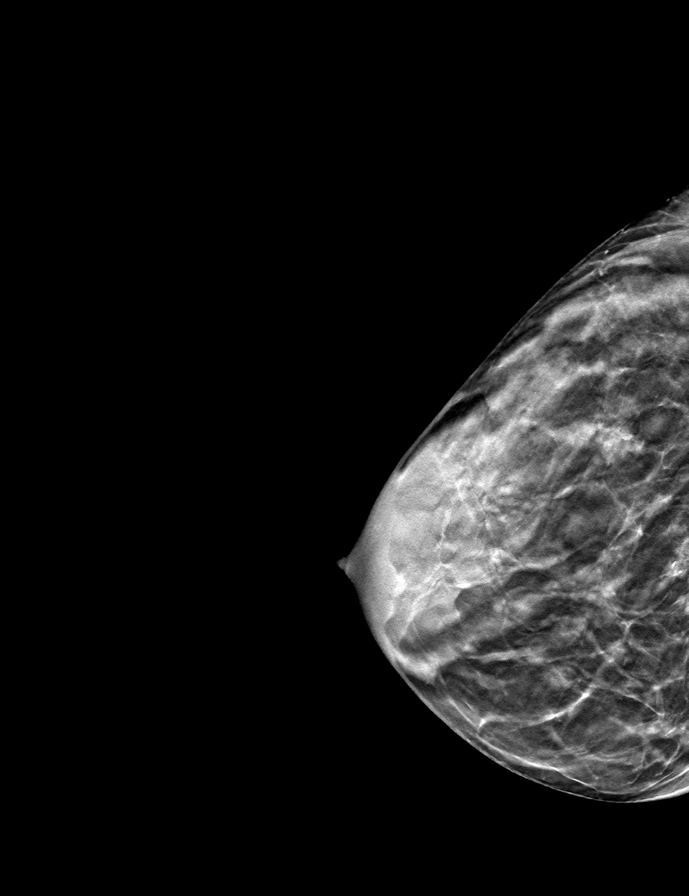

[9 of 24 positions shown; findings below may reference images not displayed]

ACR Breast Density Category d: The breast tissue is extremely dense,
which lowers the sensitivity of mammography
FINDINGS: There are no findings suspicious for malignancy.
IMPRESSION: No mammographic evidence of malignancy. A result letter of this
screening mammogram will be mailed directly to the patient.

RECOMMENDATION:
Screening mammogram in one year. (Code:[id number]-[id number])

BI-RADS CATEGORY  1: Negative.

## 2023-05-07 ENCOUNTER — Other Ambulatory Visit: Payer: Self-pay | Admitting: Psychiatry

## 2023-05-07 DIAGNOSIS — F3281 Premenstrual dysphoric disorder: Secondary | ICD-10-CM

## 2023-05-07 DIAGNOSIS — F3181 Bipolar II disorder: Secondary | ICD-10-CM

## 2023-05-21 ENCOUNTER — Other Ambulatory Visit: Payer: Self-pay | Admitting: Psychiatry

## 2023-05-21 ENCOUNTER — Telehealth: Payer: Self-pay | Admitting: Psychiatry

## 2023-05-21 DIAGNOSIS — F3181 Bipolar II disorder: Secondary | ICD-10-CM

## 2023-05-21 NOTE — Telephone Encounter (Signed)
 Received fax with [employer] No Duty form to complete.  Call pt to collect $15 @ [phone]. Put in CC box outside his door.

## 2023-06-19 ENCOUNTER — Other Ambulatory Visit: Payer: Self-pay | Admitting: Psychiatry

## 2023-06-19 DIAGNOSIS — F411 Generalized anxiety disorder: Secondary | ICD-10-CM

## 2023-06-23 ENCOUNTER — Other Ambulatory Visit: Payer: Self-pay | Admitting: Psychiatry

## 2023-06-23 DIAGNOSIS — F3181 Bipolar II disorder: Secondary | ICD-10-CM

## 2023-06-23 NOTE — Telephone Encounter (Signed)
 Dose she have enough to appt

## 2023-06-27 ENCOUNTER — Encounter: Payer: Self-pay | Admitting: Psychiatry

## 2023-06-27 ENCOUNTER — Ambulatory Visit: Admitting: Psychiatry

## 2023-06-27 DIAGNOSIS — F5105 Insomnia due to other mental disorder: Secondary | ICD-10-CM | POA: Diagnosis not present

## 2023-06-27 DIAGNOSIS — F3281 Premenstrual dysphoric disorder: Secondary | ICD-10-CM

## 2023-06-27 DIAGNOSIS — F3181 Bipolar II disorder: Secondary | ICD-10-CM

## 2023-06-27 DIAGNOSIS — F411 Generalized anxiety disorder: Secondary | ICD-10-CM

## 2023-06-27 DIAGNOSIS — F422 Mixed obsessional thoughts and acts: Secondary | ICD-10-CM | POA: Diagnosis not present

## 2023-06-27 NOTE — Progress Notes (Signed)
 Robin Daniel [id number] 11/07/1975 48 y.o.    Subjective:   Patient ID:  Robin Daniel is a 48 y.o. (DOB 09-Oct-1975) female.  Chief Complaint:  Chief Complaint  Patient presents with   Follow-up   Depression   Anxiety   Other    PMDD    Robin Daniel for an urgent visit because of problems keeping her meds straight with resultant side effect issues.  Follow-up med changes and mood.  At visit Jun 06, 2018.  Insurance problems not covering Equetro  resulted in the patient having to switch to a mixture of Tegretol -XR 100 mg tablets, 200 mg Tegretol -XR tablets, and immediate release to 200 mg carbamazepine  at night this is resulted in some patient confusion about how to take her medicines and she has called a couple of times since her last appointment having mixed her medications up and having problems.  seen October 2020.  The following was noted and no meds were changed. Overall doing well.  No sig mood swings except PMDD.  Anxiety is manageable.  No major mood swings.  Trouble with getting enough fiber.  Taken Miralax for years.   Reduced lamotrigine  in August 2020 DT dizziness and the dizziness resolved.  06/12/2019 appointment, the following is noted:  Occ Ambien .  More often alprazolam  0.5 mg HS. Usually good sleep and rested. 7 hours Good except perimenopausal.  Irregular and heavy periods and can cause her to feel pretty crappy. Avoiding replacement of hormones.  Otherwise good mood most of the time.  Not depressed since here but can feel agitated and bloated. No GI sx since reducing lamotrigine  to current dose. Plan no significant med changes.  02/05/2020 appointment with the following noted: Son 65 yo homeschooling since [name]. Mood "goodish".  Covid last month.  Recovered well except put me in a slump more depressed.  With Covid had HA and nausea and altered taste.   More depressed and ruminating lately too. Also stress medical  problems.  Wearing me down.  Anxiety around repeated breast issues.      Plan: No med changes  07/27/2020 appointment with the following noted: Robin Daniel is 48 yo.   Alternates between alprazolam  and Ambien  at night to reduce tolerance.  That seems to work. Mood has been good. No complaints about meds.  Cycle is less than in past but only once every few mos.  Still gets some PMS sx.  Irrational thoughts 7 days per month and feels like a failure as mother, wife, irritable.  H notices it. Needs new therapist. Patient denies difficulty with sleep initiation or maintenance. Still struggles with chronic anxiety and negative thoughts about herself and lately health related.  Denies appetite disturbance.  Patient reports that energy and motivation have been good.  Patient denies any difficulty with concentration.  Patient denies any suicidal ideation. Also is irritalble and anxious with period.    01/26/2021 appt noted: Good for the most part.  Covid early December and prednisone for a month then menstrual c ycle.  Don't feel as well.  Struggling more with fear and death thoughts.  Daniel really tired.  Appetite on and off.  Mind goes straight to a dark place with worry and spirals. Not felt well for a couple of weeks and hates it.  Worries over it.  Fearful of medical evaluations and sx.  Normal mammogram.  Chronic medical worry.  How am I going to handle it if something happens for real.  03/04/2021 appt noted:  with H This week seemed to need the sertraline  longer.  Become OC about health things.  Started getting  compulsive seeing doctor.  7 days/month was not enough.  Exaggerated somatic fears.  Couldn't reason and was freaking out.  Does see benefit with sertraline  markedly.   H sees it as OC.  Seems ok on the other days but the bad days are intense. No mania.    04/27/2021 appointment with the following noted: Ambien  works if rotates with Xanax  for sleep.  Occ Xanax  for anxiety. Today feels good but  crappy the last week. Hormonally related mood problems and sees benefit with sdertraline usually.  When it happens gets unrealistic health obsessions and more depressed and hopeless but it passes.   Doesn't take more than 14 sertraline  in a month but worries about taking it too much. Irregular cycles.  Sometimes doesn't feel in control of emotions.  Hard getting older and having trouble accepting this.   Never taking hormones. Worries about memory.  08/08/21 appt noted: Mood pretty steady.  Sertraline  really helped a lot prn and working out better than scheduled. Pleased with meds. About 6 weeks ago decided to eat more and specifically more protein and weight trained for 6 weeks and feels better doing so. Taiking in from 45 g protein to over 100 G protein and feels better. Ok with sleep generally. Robin Daniel 48 yo wants to RT school instead of home school and going to private school. Going to school church. Plan: no med changes  12/12/21 appt noted: Satisfied with meds.  Around period is afraid of bad things happening, accidents, illness and some when not hormonal. Thinks of death too much.  Has fantastic life and shouldn't be so afraid.  Good support.  Great life and H.  Takes sertraline  prn for a week per month and as needed.   Wants a Veterinary surgeon and needs someone in [place].   Yoga daily.  But neck pain is a problem. Not depressed. Tolerating meds.  Plan no med changes Patient's insurance would not pay for Tegretol  XR 100 mg tablets 3 in the morning and 4 at night because of quantity limits.   Therefore the prescription was rewritten Tegretol -XR 100 mg tablets 1 in the morning #30 and 1 refill and Tegretol -XR 200 mg tablets 1 in the morning and 2 at night #90 and 1 refill.  she is also to take short acting carbamazepine  100 mg tablets 2 at night. Continue lamotrigine  100 mg at noon and 200 mg at night for bipolar depression Continue the Wellbutrin  SR 150 mg in the morning as she feels that  is helpful for energy and appetite control.  And somewhat for attention and focus. Continue alternating Xanax  and Ambien  at night but is also taking it prn for anxiety.  I have okayed number 45/month Sertraline  50 mg prn PMDD.  Use prn has worked.  07/12/22 appt noted: Still doing well with meds and consistent with meds as above.  No SE Patient reports stable mood and denies depressed or irritable moods. Except still some PMDD with some benefit with sertraline  prn.  Patient denies difficulty with sleep initiation or maintenance with Xanax  generally better than zolpidem . Denies appetite disturbance.  Patient reports that energy and motivation have been good.  Patient denies any difficulty with concentration.  Patient denies any suicidal ideation. No new concerns and health I sgood.  No med changes.  02/07/23 appt noted: Pscyh meds:  Wellbutrin  SR 150 AM ,  Tegretol -XR 100 mg tablets  1 in the morning #30 and 1 refill and Tegretol -XR 200 mg tablets 1 in the morning and 2 at night #90 and 1 refill.  she is also to take short acting carbamazepine  100 mg tablets 2 at night. Continue lamotrigine  100 mg at noon and 200 mg at night for bipolar depression.   Sertraline  50 mg prn PMDD avg 5 days/month Can't get lamotrigine  close together otherwise dizzy and falls and Nausea, vomiting, falling.  Sometimes skips doses bc fear about getting it close together.   Mood struggling a lot during the Xmas.  Really awful.  Paranoid and obsessive.  No t suicidal but desperate and didn't want to live that way.    No longer hot flashes but still notices hormonal mood changes for a month.   Rough time during holidays for at least a couple of weeks.   Trying to pray over these things.   Talked to sister and it seemed to help the paranoia and other obsessive thoughts.  Consistent with CBZ dosing. No SI since here but despair and thinking she would never get better.   Plan:  Add risperidone  0.5 mg prn paranoia and obsessive  thinking hormonally to use prn.  Needs more relief.  06/27/23 appt noted: Pscyh meds:  Wellbutrin  SR 150 AM ,  Tegretol -XR 100 mg tablets 1 in the morning #30 and 1 refill and Tegretol -XR 200 mg tablets 1 in the morning and 2 at night #90 and 1 refill.  she is also to take short acting carbamazepine  100 mg tablets 2 at night. Continue lamotrigine  100 mg at noon and 200 mg at night for bipolar depression.   Sertraline  50 mg prn PMDD avg 5 days/month Plan: Add risperidone  0.5 mg prn paranoia and obsessive thinking hormonally to use prn.  Needs more relief. Anxiety about driving and H brings her here.  I can really fool people.  Very involved at school volunteering.  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  Active.  But social things wear her out when in bunches like end of school.  This is what I did when I worked. Being busy helps anxiety.  But feels it is time for HRT.  Wants to talk about it.   5 days per month horrible with dep, anxiety , paranoid.  More OC gotten bad.   Brain fog chronically.  Has to make lists. Disc health risks HRT.  She is waying this against QOL issues.   Past Psychiatric Medication Trials:  Carbamazepine  900, lamotrigine  XR 200 mg twice daily,  Wellbutrin  SR 100 mg twice daily,  naltrexone, Abilify,  Trintellix, paroxetine weakness,  sertraline , other antidepressants stimulants caused mania,  alprazolam , Ambien ,  N-acetylcysteine,  topiramate,   She also has had a good response to the full dose of Equetro .  Review of Systems  Neurological:  Negative for seizures and weakness.  Psychiatric/Behavioral:  The patient is nervous/anxious.     Medications: I have reviewed the patient's current medications.  Current Outpatient Medications  Medication Sig Dispense Refill   ALPRAZolam  (XANAX ) 1 MG tablet TAKE ONE TABLET BY MOUTH TWICE A DAY AS NEEDED FOR ANXIETY 45 tablet 0   bimatoprost (LATISSE) 0.03 % ophthalmic solution PLACE 1 DROP ON APPLICATOR & APPLY EVENLY AT THE BASE OF EYELASHES NIGHTLY  ON BOTH EYES.     buPROPion  (WELLBUTRIN  SR) 150 MG 12 hr tablet TAKE 1 TABLET BY MOUTH DAILY 90 tablet 0   carbamazepine  (TEGRETOL  XR) 100 MG 12 hr tablet Take 1 tablet (100 mg total) by mouth every  morning. 90 tablet 1   carbamazepine  (TEGRETOL  XR) 200 MG 12 hr tablet TAKE 1 TABLET BY MOUTH EVERY MORNING AND TAKE 2 TABLETS BY MOUTH EVERY EVENING 270 tablet 0   carbamazepine  (TEGRETOL ) 100 MG chewable tablet Chew 2 tablets (200 mg total) by mouth at bedtime. 180 tablet 1   hydrocortisone  2.5 % cream Apply topically 2 (two) times daily as needed (Rash). Use for up to 1 week. Apply to eyelids for rash 3.5 g 0   LamoTRIgine  300 MG TB24 24 hour tablet TAKE 1 TABLET BY MOUTH AT BEDTIME 90 tablet 0   levothyroxine (SYNTHROID, LEVOTHROID) 50 MCG tablet Take 1 tablet by mouth every morning.  5   risperiDONE  (RISPERDAL ) 0.5 MG tablet TAKE 1 TABLET BY MOUTH AS NEEDED FOR SEVERE ANXIETY 30 tablet 2   sertraline  (ZOLOFT ) 50 MG tablet TAKE 1 TABLET BY MOUTH DAILY 90 tablet 0   Tretinoin  (ALTRENO ) 0.05 % LOTN Apply 1 Application topically at bedtime. Qhs to face as tolerated 45 g 11   zolpidem  (AMBIEN ) 10 MG tablet Take 0.5-1 tablets (5-10 mg total) by mouth at bedtime as needed. for sleep 30 tablet 1   No current facility-administered medications for this visit.    Medication Side Effects: None no longer dizziness, nausea  Allergies: No Known Allergies  Past Medical History:  Diagnosis Date   Bipolar disorder (HCC)    Hypothyroidism    Melanoma (HCC)    Right back. 2021. Breslow's 1.87mm, Clark's level IV, mitotic index 6/mm2    Family History  Problem Relation Age of Onset   Anxiety disorder Father    Breast cancer Neg Hx     Social History   Socioeconomic History   Marital status: Married    Spouse name: Not on file   Number of children: Not on file   Years of education: Not on file   Highest education level: Not on file  Occupational History   Not on file  Tobacco Use   Smoking  status: Never   Smokeless tobacco: Never  Substance and Sexual Activity   Alcohol use: No   Drug use: No   Sexual activity: Not on file  Other Topics Concern   Not on file  Social History Narrative   Not on file   Social Drivers of Health   Financial Resource Strain: Low Risk  (02/27/2023)   Received [facility]   Overall Financial Resource Strain (CARDIA)    Difficulty of Paying Living Expenses: Not hard at all  Food Insecurity: No Food Insecurity (02/27/2023)   Received from [facility]   Hunger Vital Sign    Worried About Running Out of Food in the Last Year: Never true    Ran Out of Food in the Last Year: Never true  Transportation Needs: No Transportation Needs (02/27/2023)   Received from [facility] - Transportation    In the past 12 months, has lack of transportation kept you from medical appointments or from getting medications?: No    Lack of Transportation (Non-Medical): No  Physical Activity: Not on file  Stress: Not on file  Social Connections: Not on file  Intimate Partner Violence: Not on file    Past Medical History, Surgical history, Social history, and Family history were reviewed and updated as appropriate.   Please see review of systems for further details on the patient's review from today.   Objective:   Physical Exam:  There were  no vitals taken for this visit.  Physical Exam Constitutional:      General: She is not in acute distress.    Appearance: She is well-developed.  Musculoskeletal:        General: No deformity.  Neurological:     Mental Status: She is alert and oriented to person, place, and time.     Cranial Nerves: No dysarthria.     Coordination: Coordination normal.  Psychiatric:        Attention and Perception: Attention normal. She does not perceive auditory hallucinations.        Mood and Affect: Mood is anxious. Mood is not depressed. Affect is not labile, angry,  tearful or inappropriate.        Speech: Speech normal.        Behavior: Behavior normal. Behavior is cooperative.        Thought Content: Thought content normal. Thought content is not paranoid or delusional. Thought content does not include homicidal or suicidal ideation. Thought content does not include suicidal plan.        Cognition and Memory: Cognition and memory normal.        Judgment: Judgment normal.     Comments: Insight good.   Episodic health obsessions      Lab Review:  No results found for: "NA", "K", "CL", "CO2", "GLUCOSE", "BUN", "CREATININE", "CALCIUM", "PROT", "ALBUMIN", "AST", "ALT", "ALKPHOS", "BILITOT", "GFRNONAA", "GFRAA"     Component Value Date/Time   WBC 16.2 (H) 10/29/2007 0505   RBC 2.70 (L) 10/29/2007 0505   HGB 8.7 DELTA CHECK NOTED (L) 10/29/2007 0505   HCT 25.9 (L) 10/29/2007 0505   PLT 183 10/29/2007 0505   MCV 95.8 10/29/2007 0505   MCHC 33.6 10/29/2007 0505   RDW 14.8 10/29/2007 0505    No results found for: "POCLITH", "LITHIUM"   Lab Results  Component Value Date   CBMZ 11.1 11/09/2017   CBMZ 10/24/22 10.3  .res Assessment: Plan:    Tammera was seen today for follow-up, depression, anxiety and other.  Diagnoses and all orders for this visit:  Bipolar II disorder (HCC)  PMDD (premenstrual dysphoric disorder)  Generalized anxiety disorder  OCD - safety and infestation focused  Insomnia due to mental condition      55 min appt We discussed Patient was stable on 900 mg of Equetro  for an extended period of time with the exception of PMDD.  However she eventually started developing side effects of dizziness and nausea and vomiting and could no longer tolerate that dosage.  The dosage was cut back to 600 mg and she has subsequently developed a mixture of obsessive-compulsive and irritable hypomanic symptoms.  She was having unusual intrusive obsessive thoughts which were about lice and then about Covid.  She had been successful at  transitioning from Equetro  to a mixture of immediate release and extended release carbamazepine .  She was tolerating the 900 mg daily which had markedly reduced the intrusive obsessive thoughts about Covid and other obsessive anxious thoughts.  It had helped to reduce the dysphoric mixed mood symptoms.    In recent months the primary problem has been severe PMDD associated with depression, severe anxiety and obsessing on unrealistic dangers to the point that she describes herself as being "paranoid".  She has discussed this with her primary care doctor who has agreed to address this problem using hormone replacement therapy as psychiatric medicines have been ineffective or not effective enough up to this point. We discussed the pros and cons of  hormone replacement therapy specifically around risks of cancer and clotting disorders as well as alternative approaches including alternative medicine approaches and psychiatric approaches such as using higher doses of the either SSRIs or antipsychotic medications.  Given that the problem is clearly hormone driven the most direct approach to managing these symptoms would be hormonal.  She is going to pursue that with her doctor and then report back as to its effectiveness and then we might consider other options if needed. Extensive disc of the science of this and journal articles that are relevant as well as how to evaluate statistical risks. Disc DDI CBZ with HRT and may need higher doses of HRT to get benefit.  Disc  CBMZ 10/24/22 10.3.  and it's meaning and significance.  No indication to change dosing.    Patient's insurance would not pay for Tegretol  XR 100 mg tablets 3 in the morning and 4 at night because of quantity limits.   Therefore the prescription was rewritten Tegretol -XR 100 mg tablets 1 in the morning #30 and 1 refill and Tegretol -XR 200 mg tablets 1 in the morning and 2 at night #90 and 1 refill.  she is also to take short acting carbamazepine  100  mg tablets 2 at night.  Switched lamotrigine  to ER 300 mg at night for bipolar depression bc marked SE taking IR with dizziness, NV  Continue the Wellbutrin  SR 150 mg in the morning as she feels that is helpful for energy and appetite control.  And somewhat for attention and focus.  Continue alternating Xanax  and Ambien  at night but is also taking it prn for anxiety.  I have okayed number 45/month  Sertraline  50 mg prn PMDD.  Use prn has partiallly worked  Add ed risperidone  0.5 mg prn paranoia and obsessive thinking hormonally to use prn.  Needs more relief.  Discussed potential metabolic side effects associated with atypical antipsychotics, as well as potential risk for movement side effects. Advised pt to contact office if movement side effects occur.   She is taking an unusual mix between long-acting and short acting carbamazepine  in order to achieve maximum mood benefit but avoid side effects of nausea and dizziness without this combination and also dictated by insurance limitations.  We discussed the short-term risks associated with benzodiazepines including sedation and increased fall risk among others.  Discussed long-term side effect risk including dependence, potential withdrawal symptoms, and the potential eventual dose-related risk of dementia.  But recent studies from 2020 dispute this association between benzodiazepines and dementia risk. Newer studies in 2020 do not support an association with dementia.  FU 4 mos  Nori Beat, MD, DFAPA   Please see After Visit Summary for patient specific instructions.   Future Appointments  Date Time Provider Department Center  11/01/2023 10:30 AM Cottle, Kennedy Peabody., MD [facility] None  11/29/2023  9:45 AM Harris Liming, MD [facility] None    No orders of the defined types were placed in this encounter.      -------------------------------

## 2023-07-09 ENCOUNTER — Telehealth: Payer: Self-pay | Admitting: Psychiatry

## 2023-07-09 NOTE — Telephone Encounter (Signed)
 Pt called at 11:40a stating that a doctor wants her start a probiotic and she wants to know if it's ok with the current meds she takes.  Both medications are [employer]ulum brand, but it would be either the Metabolic Daily or Akkermansia. She said she doesn't want to take anything without first finding out from Dr Toi Foster if it's ok.  Next appt 10/7

## 2023-07-10 NOTE — Telephone Encounter (Signed)
 Suggested she call her pharmacy.

## 2023-07-31 ENCOUNTER — Other Ambulatory Visit: Payer: Self-pay | Admitting: Psychiatry

## 2023-07-31 DIAGNOSIS — F3181 Bipolar II disorder: Secondary | ICD-10-CM

## 2023-08-16 ENCOUNTER — Other Ambulatory Visit: Payer: Self-pay | Admitting: Psychiatry

## 2023-08-16 DIAGNOSIS — F3181 Bipolar II disorder: Secondary | ICD-10-CM

## 2023-08-22 ENCOUNTER — Other Ambulatory Visit: Payer: Self-pay | Admitting: Psychiatry

## 2023-08-22 DIAGNOSIS — F411 Generalized anxiety disorder: Secondary | ICD-10-CM

## 2023-09-08 ENCOUNTER — Other Ambulatory Visit: Payer: Self-pay | Admitting: Psychiatry

## 2023-09-08 DIAGNOSIS — F3181 Bipolar II disorder: Secondary | ICD-10-CM

## 2023-09-26 ENCOUNTER — Other Ambulatory Visit: Payer: Self-pay | Admitting: Psychiatry

## 2023-09-26 DIAGNOSIS — F3181 Bipolar II disorder: Secondary | ICD-10-CM

## 2023-11-01 ENCOUNTER — Ambulatory Visit: Admitting: Psychiatry

## 2023-11-06 ENCOUNTER — Other Ambulatory Visit: Payer: Self-pay | Admitting: Psychiatry

## 2023-11-06 DIAGNOSIS — F3181 Bipolar II disorder: Secondary | ICD-10-CM

## 2023-11-08 ENCOUNTER — Other Ambulatory Visit: Payer: Self-pay | Admitting: Psychiatry

## 2023-11-08 DIAGNOSIS — F3181 Bipolar II disorder: Secondary | ICD-10-CM

## 2023-11-19 ENCOUNTER — Other Ambulatory Visit: Payer: Self-pay | Admitting: Psychiatry

## 2023-11-19 DIAGNOSIS — F3181 Bipolar II disorder: Secondary | ICD-10-CM

## 2023-11-22 ENCOUNTER — Ambulatory Visit: Payer: 59 | Admitting: Dermatology

## 2023-11-29 ENCOUNTER — Other Ambulatory Visit: Payer: Self-pay | Admitting: Psychiatry

## 2023-11-29 ENCOUNTER — Ambulatory Visit: Admitting: Dermatology

## 2023-11-29 DIAGNOSIS — F3181 Bipolar II disorder: Secondary | ICD-10-CM

## 2023-12-05 ENCOUNTER — Other Ambulatory Visit: Payer: Self-pay | Admitting: Internal Medicine

## 2023-12-05 DIAGNOSIS — Z1231 Encounter for screening mammogram for malignant neoplasm of breast: Secondary | ICD-10-CM

## 2023-12-11 ENCOUNTER — Ambulatory Visit: Admitting: Dermatology

## 2023-12-11 DIAGNOSIS — L821 Other seborrheic keratosis: Secondary | ICD-10-CM | POA: Diagnosis not present

## 2023-12-11 DIAGNOSIS — Z85820 Personal history of malignant melanoma of skin: Secondary | ICD-10-CM

## 2023-12-11 DIAGNOSIS — D229 Melanocytic nevi, unspecified: Secondary | ICD-10-CM

## 2023-12-11 DIAGNOSIS — L578 Other skin changes due to chronic exposure to nonionizing radiation: Secondary | ICD-10-CM

## 2023-12-11 DIAGNOSIS — L814 Other melanin hyperpigmentation: Secondary | ICD-10-CM

## 2023-12-11 DIAGNOSIS — L818 Other specified disorders of pigmentation: Secondary | ICD-10-CM

## 2023-12-11 DIAGNOSIS — Z1283 Encounter for screening for malignant neoplasm of skin: Secondary | ICD-10-CM | POA: Diagnosis not present

## 2023-12-11 DIAGNOSIS — W908XXA Exposure to other nonionizing radiation, initial encounter: Secondary | ICD-10-CM | POA: Diagnosis not present

## 2023-12-11 DIAGNOSIS — D1801 Hemangioma of skin and subcutaneous tissue: Secondary | ICD-10-CM

## 2023-12-11 DIAGNOSIS — L816 Other disorders of diminished melanin formation: Secondary | ICD-10-CM

## 2023-12-11 DIAGNOSIS — Z7189 Other specified counseling: Secondary | ICD-10-CM

## 2023-12-11 NOTE — Progress Notes (Unsigned)
 Follow-Up Visit   Subjective  Robin Daniel is a 48 y.o. female who presents for the following: Skin Cancer Screening and Full Body Skin Exam Hx of melanoma at right back 2021  The patient presents for Total-Body Skin Exam (TBSE) for skin cancer screening and mole check. The patient has spots, moles and lesions to be evaluated, some may be new or changing and the patient may have concern these could be cancer.  The following portions of the chart were reviewed this encounter and updated as appropriate: medications, allergies, medical history  Review of Systems:  No other skin or systemic complaints except as noted in HPI or Assessment and Plan.  Objective  Well appearing patient in no apparent distress; mood and affect are within normal limits.  A full examination was performed including scalp, head, eyes, ears, nose, lips, neck, chest, axillae, abdomen, back, buttocks, bilateral upper extremities, bilateral lower extremities, hands, feet, fingers, toes, fingernails, and toenails. All findings within normal limits unless otherwise noted below.   Relevant physical exam findings are noted in the Assessment and Plan.    Assessment & Plan   HISTORY OF MELANOMA - patient was surprised when [facility] shared that patient had a history of melanoma. Patient stated that she was told by the surgeon who excised the melanoma that is was not a true melanoma and that the excision was overkill. Patient states that the original biopsy was done with Dr Chrystie at [facility]. First pathology report was lost. It was sent for a second opinion. - extensively discussed that pathology reports from biopsy in 2021 state the lesion was an invasive melanoma, and that wide local excision would be the minimum standard of care. Breslow of 1.4 mm would mean sentinel node biopsy should be considered. Patient states the surgeon at the time did not think sentinel node biopsy was necessary so  patient jointly decided not to pursue it. Patient reports no systemic illnesses or symptoms suggestive of metastatic disease since then. - R back, excised 2021. Breslow's 1.102mm, Clark's level IV, mitotic index 6/mm2  (pathology reports obtained from [facility] and now in media tab dated 11/21/22) Copy of message Dr. Claudene sent previous MD Surgeon last year regarding information - sent message to Dr Donnice Lunger MD surgeon from 08/29/19 to clarify his understanding of this case The surgeon who removed your melanoma, Dr Donnice Lunger, has retired. Thus, I can't get any further information from him directly. .  - No evidence of recurrence today - No lymphadenopathy - Recommend regular full body skin exams - Recommend daily broad spectrum sunscreen SPF 30+ to sun-exposed areas, reapply every 2 hours as needed.  - Call if any new or changing lesions are noted between office visits  - patient prefers to continue annual skin checks   SKIN CANCER SCREENING PERFORMED TODAY.  ACTINIC DAMAGE - Chronic condition, secondary to cumulative UV/sun exposure - diffuse scaly erythematous macules with underlying dyspigmentation - Recommend daily broad spectrum sunscreen SPF 30+ to sun-exposed areas, reapply every 2 hours as needed.  - Staying in the shade or wearing long sleeves, sun glasses (UVA+UVB protection) and wide brim hats (4-inch brim around the entire circumference of the hat) are also recommended for sun protection.  - Call for new or changing lesions.  LENTIGINES, SEBORRHEIC KERATOSES, HEMANGIOMAS - Benign normal skin lesions - Benign-appearing - Call for any changes  MELANOCYTIC NEVI - Tan-brown and/or pink-flesh-colored symmetric macules and papules - Benign appearing on exam today - Observation -  Call clinic for new or changing moles - Recommend daily use of broad spectrum spf 30+ sunscreen to sun-exposed areas.   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 Cause is unknown but it can be a genetic trait. It is not related to vitiligo. There is no treatment. Recommend photoprotection and regular use of spf 30 or higher sunscreen which may help prevent the development of more white spots. Treatment Plan:  Benign, observe.     Return in about 1 year (around 12/10/2024) for TBSE.  IEleanor Blush, CMA, am acting as scribe for Alm Rhyme, MD.   Documentation: I have reviewed the above documentation for accuracy and completeness, and I agree with the above.  Alm Rhyme, MD

## 2023-12-11 NOTE — Patient Instructions (Addendum)
 Seborrheic Keratosis  What causes seborrheic keratoses? Seborrheic keratoses are harmless, common skin growths that first appear during adult life.  As time goes by, more growths appear.  Some people may develop a large number of them.  Seborrheic keratoses appear on both covered and uncovered body parts.  They are not caused by sunlight.  The tendency to develop seborrheic keratoses can be inherited.  They vary in color from skin-colored to gray, brown, or even black.  They can be either smooth or have a rough, warty surface.   Seborrheic keratoses are superficial and look as if they were stuck on the skin.  Under the microscope this type of keratosis looks like layers upon layers of skin.  That is why at times the top layer may seem to fall off, but the rest of the growth remains and re-grows.    Treatment Seborrheic keratoses do not need to be treated, but can easily be removed in the office.  Seborrheic keratoses often cause symptoms when they rub on clothing or jewelry.  Lesions can be in the way of shaving.  If they become inflamed, they can cause itching, soreness, or burning.  Removal of a seborrheic keratosis can be accomplished by freezing, burning, or surgery. If any spot bleeds, scabs, or grows rapidly, please return to have it checked, as these can be an indication of a skin cancer.   Melanoma ABCDEs  Melanoma is the most dangerous type of skin cancer, and is the leading cause of death from skin disease.  You are more likely to develop melanoma if you: Have light-colored skin, light-colored eyes, or red or blond hair Spend a lot of time in the sun Tan regularly, either outdoors or in a tanning bed Have had blistering sunburns, especially during childhood Have a close family member who has had a melanoma Have atypical moles or large birthmarks  Early detection of melanoma is key since treatment is typically straightforward and cure rates are extremely high if we catch it early.    The first sign of melanoma is often a change in a mole or a new dark spot.  The ABCDE system is a way of remembering the signs of melanoma.  A for asymmetry:  The two halves do not match. B for border:  The edges of the growth are irregular. C for color:  A mixture of colors are present instead of an even brown color. D for diameter:  Melanomas are usually (but not always) greater than 6mm - the size of a pencil eraser. E for evolution:  The spot keeps changing in size, shape, and color.  Please check your skin once per month between visits. You can use a small mirror in front and a large mirror behind you to keep an eye on the back side or your body.   If you see any new or changing lesions before your next follow-up, please call to schedule a visit.  Please continue daily skin protection including broad spectrum sunscreen SPF 30+ to sun-exposed areas, reapplying every 2 hours as needed when you're outdoors.   Staying in the shade or wearing long sleeves, sun glasses (UVA+UVB protection) and wide brim hats (4-inch brim around the entire circumference of the hat) are also recommended for sun protection.    Due to recent changes in healthcare laws, you may see results of your pathology and/or laboratory studies on MyChart before the doctors have had a chance to review them. We understand that in some cases there may  be results that are confusing or concerning to you. Please understand that not all results are received at the same time and often the doctors may need to interpret multiple results in order to provide you with the best plan of care or course of treatment. Therefore, we ask that you please give us  2 business days to thoroughly review all your results before contacting the office for clarification. Should we see a critical lab result, you will be contacted sooner.   If You Need Anything After Your Visit  If you have any questions or concerns for your doctor, please call our main  line at [phone] and press option 4 to reach your doctor's medical assistant. If no one answers, please leave a voicemail as directed and we will return your call as soon as possible. Messages left after 4 pm will be answered the following business day.   You may also send us  a message via MyChart. We typically respond to MyChart messages within 1-2 business days.  For prescription refills, please ask your pharmacy to contact our office. Our fax number is [phone].  If you have an urgent issue when the clinic is closed that cannot wait until the next business day, you can page your doctor at the number below.    Please note that while we do our best to be available for urgent issues outside of office hours, we are not available 24/7.   If you have an urgent issue and are unable to reach us , you may choose to seek medical care at your doctor's office, retail clinic, urgent care center, or emergency room.  If you have a medical emergency, please immediately call 911 or go to the emergency department.  Pager Numbers  - Dr. Hester: [phone]  - Dr. Jackquline: [phone]  - Dr. Claudene: [phone]   - Dr. Raymund: [phone]  In the event of inclement weather, please call our main line at [phone] for an update on the status of any delays or closures.  Dermatology Medication Tips: Please keep the boxes that topical medications come in in order to help keep track of the instructions about where and how to use these. Pharmacies typically print the medication instructions only on the boxes and not directly on the medication tubes.   If your medication is too expensive, please contact our office at [phone] option 4 or send us  a message through MyChart.   We are unable to tell what your co-pay for medications will be in advance as this is different depending on your insurance coverage. However, we may be able to find a substitute medication at lower cost or fill out  paperwork to get insurance to cover a needed medication.   If a prior authorization is required to get your medication covered by your insurance company, please allow us  1-2 business days to complete this process.  Drug prices often vary depending on where the prescription is filled and some pharmacies may offer cheaper prices.  The website www.goodrx.com contains coupons for medications through different pharmacies. The prices here do not account for what the cost may be with help from insurance (it may be cheaper with your insurance), but the website can give you the price if you did not use any insurance.  - You can print the associated coupon and take it with your prescription to the pharmacy.  - You may also stop by our office during regular business hours and pick up a GoodRx coupon card.  - If you need  your prescription sent electronically to a different pharmacy, notify our office through [facility] or by phone at [phone] option 4.     Si Usted Necesita Algo Despus de Su Visita  Tambin puede enviarnos un mensaje a travs de Clinical cytogeneticist. Por lo general respondemos a los mensajes de MyChart en el transcurso de 1 a 2 das hbiles.  Para renovar recetas, por favor pida a su farmacia que se ponga en contacto con nuestra oficina. Randi lakes de fax es [place] [phone].  Si tiene un asunto urgente cuando la clnica est cerrada y que no puede esperar hasta el siguiente da hbil, puede llamar/localizar a su doctor(a) al nmero que aparece a continuacin.   Por favor, tenga en cuenta que aunque hacemos todo lo posible para estar disponibles para asuntos urgentes fuera del horario de [place], no estamos disponibles las 24 horas del da, los 7 [address]  [address] de la [place].   Si tiene un problema urgente y no puede comunicarse con nosotros, puede optar por buscar atencin mdica  en el consultorio de su doctor(a), en una clnica privada, en un centro de atencin urgente o en una sala de  emergencias.  Si tiene Engineer, drilling, por favor llame inmediatamente al 911 o vaya a la sala de emergencias.  Nmeros de bper  - Dr. Hester: [phone]  - Dra. Jackquline: [phone]  - Dr. Claudene: [phone]  - Dra. Kitts: [phone]  En caso de inclemencias del [place], por favor llame a nuestra lnea principal al [phone] para una actualizacin sobre el estado de cualquier retraso o cierre.  Consejos para la medicacin en dermatologa: Por favor, guarde las cajas en las que vienen los medicamentos de uso tpico para ayudarle a seguir las instrucciones sobre dnde y cmo usarlos. Las farmacias generalmente imprimen las instrucciones del medicamento slo en las cajas y no directamente en los tubos del [place].   Si su medicamento es muy caro, por favor, pngase en contacto con landry rieger llamando al [phone] y presione la opcin 4 o envenos un mensaje a travs de Clinical cytogeneticist.   No podemos decirle cul ser su copago por los medicamentos por adelantado ya que esto es diferente dependiendo de la cobertura de su seguro. Sin embargo, es posible que podamos encontrar un medicamento sustituto a Audiological scientist un formulario para que el seguro cubra el medicamento que se considera necesario.   Si se requiere una autorizacin previa para que su compaa de seguros malta su medicamento, por favor permtanos de 1 a 2 das hbiles para completar este proceso.  Los precios de los medicamentos varan con frecuencia dependiendo del Environmental consultant de dnde se surte la receta y alguna farmacias pueden ofrecer precios ms baratos.  El sitio web www.goodrx.com tiene cupones para medicamentos de Health and safety inspector. Los precios aqu no tienen en cuenta lo que podra costar con la ayuda del seguro (puede ser ms barato con su seguro), pero el sitio web puede darle el precio si no utiliz Tourist information centre manager.  - Puede imprimir el cupn correspondiente y llevarlo con su receta a la  farmacia.  - Tambin puede pasar por nuestra oficina durante el horario de atencin regular y Education officer, museum una tarjeta de cupones de GoodRx.  - Si necesita que su receta se enve electrnicamente a una farmacia diferente, informe a nuestra oficina a travs de MyChart de [place] o por telfono llamando al [phone] y presione la opcin 4.

## 2023-12-12 ENCOUNTER — Encounter: Payer: Self-pay | Admitting: Dermatology

## 2023-12-18 ENCOUNTER — Encounter: Payer: Self-pay | Admitting: Psychiatry

## 2023-12-18 ENCOUNTER — Ambulatory Visit: Admitting: Psychiatry

## 2023-12-18 DIAGNOSIS — F411 Generalized anxiety disorder: Secondary | ICD-10-CM | POA: Diagnosis not present

## 2023-12-18 DIAGNOSIS — F5105 Insomnia due to other mental disorder: Secondary | ICD-10-CM | POA: Diagnosis not present

## 2023-12-18 DIAGNOSIS — F422 Mixed obsessional thoughts and acts: Secondary | ICD-10-CM | POA: Diagnosis not present

## 2023-12-18 DIAGNOSIS — F3281 Premenstrual dysphoric disorder: Secondary | ICD-10-CM

## 2023-12-18 DIAGNOSIS — F3181 Bipolar II disorder: Secondary | ICD-10-CM | POA: Diagnosis not present

## 2023-12-18 MED ORDER — ALPRAZOLAM 0.5 MG PO TABS: ORAL_TABLET | ORAL | 5 refills | Status: AC

## 2023-12-18 MED ORDER — CARBAMAZEPINE ER 100 MG PO TB12: 100.0000 mg | ORAL_TABLET | Freq: Every morning | ORAL | 0 refills | Status: AC

## 2023-12-18 MED ORDER — BUPROPION HCL ER (SR) 150 MG PO TB12: 150.0000 mg | ORAL_TABLET | Freq: Every day | ORAL | 0 refills | Status: AC

## 2023-12-18 MED ORDER — CARBAMAZEPINE ER 200 MG PO TB12: ORAL_TABLET | ORAL | 1 refills | Status: AC

## 2023-12-18 MED ORDER — CARBAMAZEPINE 100 MG PO CHEW: 200.0000 mg | CHEWABLE_TABLET | Freq: Every evening | ORAL | 1 refills | Status: AC

## 2023-12-18 MED ORDER — LAMOTRIGINE ER 300 MG PO TB24: 1.0000 | ORAL_TABLET | Freq: Every day | ORAL | 2 refills | Status: AC

## 2023-12-18 NOTE — Progress Notes (Signed)
 Robin Daniel [id number] 04-Mar-1975 48 y.o.    Subjective:   Patient ID:  Robin Daniel is a 48 y.o. (DOB 06/26/1975) female.  Chief Complaint:  Chief Complaint  Patient presents with   Follow-up   Depression   Anxiety    Robin Daniel call yesterday for an urgent visit because of problems keeping her meds straight with resultant side effect issues.  Follow-up med changes and mood.  At visit Jun 06, 2018.  Insurance problems not covering Equetro  resulted in the patient having to switch to a mixture of Tegretol -XR 100 mg tablets, 200 mg Tegretol -XR tablets, and immediate release to 200 mg carbamazepine  at night this is resulted in some patient confusion about how to take her medicines and she has called a couple of times since her last appointment having mixed her medications up and having problems.  seen October 2020.  The following was noted and no meds were changed. Overall doing well.  No sig mood swings except PMDD.  Anxiety is manageable.  No major mood swings.  Trouble with getting enough fiber.  Taken Miralax for years.   Reduced lamotrigine  in August 2020 DT dizziness and the dizziness resolved.  06/12/2019 appointment, the following is noted:  Occ Ambien .  More often alprazolam  0.5 mg HS. Usually good sleep and rested. 7 hours Good except perimenopausal.  Irregular and heavy periods and can cause her to feel pretty crappy. Avoiding replacement of hormones.  Otherwise good mood most of the time.  Not depressed since here but can feel agitated and bloated. No GI sx since reducing lamotrigine  to current dose. Plan no significant med changes.  02/05/2020 appointment with the following noted: Son 79 yo homeschooling since Robin Daniel. Mood goodish.  Covid last month.  Recovered well except put me in a slump more depressed.  With Covid had HA and nausea and altered taste.   More depressed and ruminating lately too. Also stress medical problems.  Wearing  me down.  Anxiety around repeated breast issues.      Plan: No med changes  07/27/2020 appointment with the following noted: Robin Daniel is 48 yo.   Alternates between alprazolam  and Ambien  at night to reduce tolerance.  That seems to work. Mood has been good. No complaints about meds.  Cycle is less than in past but only once every few mos.  Still gets some PMS sx.  Irrational thoughts 7 days per month and feels like a failure as mother, wife, irritable.  H notices it. Needs new therapist. Patient denies difficulty with sleep initiation or maintenance. Still struggles with chronic anxiety and negative thoughts about herself and lately health related.  Denies appetite disturbance.  Patient reports that energy and motivation have been good.  Patient denies any difficulty with concentration.  Patient denies any suicidal ideation. Also is irritalble and anxious with period.    01/26/2021 appt noted: Good for the most part.  Covid early December and prednisone for a month then menstrual c ycle.  Don't feel as well.  Struggling more with fear and death thoughts.  Yesterday really tired.  Appetite on and off.  Mind goes straight to a dark place with worry and spirals. Not felt well for a couple of weeks and hates it.  Worries over it.  Fearful of medical evaluations and sx.  Normal mammogram.  Chronic medical worry.  How am I going to handle it if something happens for real.  03/04/2021 appt noted:  with H This week seemed to need  the sertraline  longer.  Become OC about health things.  Started getting  compulsive seeing doctor.  7 days/month was not enough.  Exaggerated somatic fears.  Couldn't reason and was freaking out.  Does see benefit with sertraline  markedly.   H sees it as OC.  Seems ok on the other days but the bad days are intense. No mania.    04/27/2021 appointment with the following noted: Ambien  works if rotates with Xanax  for sleep.  Occ Xanax  for anxiety. Today feels good but crappy the last  week. Hormonally related mood problems and sees benefit with sdertraline usually.  When it happens gets unrealistic health obsessions and more depressed and hopeless but it passes.   Doesn't take more than 14 sertraline  in a month but worries about taking it too much. Irregular cycles.  Sometimes doesn't feel in control of emotions.  Hard getting older and having trouble accepting this.   Never taking hormones. Worries about memory.  08/08/21 appt noted: Mood pretty steady.  Sertraline  really helped a lot prn and working out better than scheduled. Pleased with meds. About 6 weeks ago decided to eat more and specifically more protein and weight trained for 6 weeks and feels better doing so. Taiking in from 45 g protein to over 100 G protein and feels better. Ok with sleep generally. Robin Daniel 48 yo wants to RT school instead of home school and going to private school. Going to school church. Plan: no med changes  12/12/21 appt noted: Satisfied with meds.  Around period is afraid of bad things happening, accidents, illness and some when not hormonal. Thinks of death too much.  Has fantastic life and shouldn't be so afraid.  Good support.  Great life and H.  Takes sertraline  prn for a week per month and as needed.   Wants a veterinary surgeon and needs someone in [place].   Yoga daily.  But neck pain is a problem. Not depressed. Tolerating meds.  Plan no med changes Patient's insurance would not pay for Tegretol  XR 100 mg tablets 3 in the morning and 4 at night because of quantity limits.   Therefore the prescription was rewritten Tegretol -XR 100 mg tablets 1 in the morning #30 and 1 refill and Tegretol -XR 200 mg tablets 1 in the morning and 2 at night #90 and 1 refill.  she is also to take short acting carbamazepine  100 mg tablets 2 at night. Continue lamotrigine  100 mg at noon and 200 mg at night for bipolar depression Continue the Wellbutrin  SR 150 mg in the morning as she feels that is helpful for  energy and appetite control.  And somewhat for attention and focus. Continue alternating Xanax  and Ambien  at night but is also taking it prn for anxiety.  I have okayed number 45/month Sertraline  50 mg prn PMDD.  Use prn has worked.  07/12/22 appt noted: Still doing well with meds and consistent with meds as above.  No SE Patient reports stable mood and denies depressed or irritable moods. Except still some PMDD with some benefit with sertraline  prn.  Patient denies difficulty with sleep initiation or maintenance with Xanax  generally better than zolpidem . Denies appetite disturbance.  Patient reports that energy and motivation have been good.  Patient denies any difficulty with concentration.  Patient denies any suicidal ideation. No new concerns and health I sgood.  No med changes.  02/07/23 appt noted: Pscyh meds:  Wellbutrin  SR 150 AM ,  Tegretol -XR 100 mg tablets 1 in the morning #30 and 1  refill and Tegretol -XR 200 mg tablets 1 in the morning and 2 at night #90 and 1 refill.  she is also to take short acting carbamazepine  100 mg tablets 2 at night. Continue lamotrigine  100 mg at noon and 200 mg at night for bipolar depression.   Sertraline  50 mg prn PMDD avg 5 days/month Can't get lamotrigine  close together otherwise dizzy and falls and Nausea, vomiting, falling.  Sometimes skips doses bc fear about getting it close together.   Mood struggling a lot during the Xmas.  Really awful.  Paranoid and obsessive.  No t suicidal but desperate and didn't want to live that way.    No longer hot flashes but still notices hormonal mood changes for a month.   Rough time during holidays for at least a couple of weeks.   Trying to pray over these things.   Talked to sister and it seemed to help the paranoia and other obsessive thoughts.  Consistent with CBZ dosing. No SI since here but despair and thinking she would never get better.   Plan:  Add risperidone  0.5 mg prn paranoia and obsessive thinking  hormonally to use prn.  Needs more relief.  06/27/23 appt noted: Pscyh meds:  Wellbutrin  SR 150 AM ,  Tegretol -XR 100 mg tablets 1 in the morning #30 and 1 refill and Tegretol -XR 200 mg tablets 1 in the morning and 2 at night #90 and 1 refill.  she is also to take short acting carbamazepine  100 mg tablets 2 at night. Continue lamotrigine  100 mg at noon and 200 mg at night for bipolar depression.   Sertraline  50 mg prn PMDD avg 5 days/month Plan: Add risperidone  0.5 mg prn paranoia and obsessive thinking hormonally to use prn.  Needs more relief. Anxiety about driving and H brings her here.  I can really fool people.  Very involved at school volunteering.  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  Active.  But social things wear her out when in bunches like end of school.  This is what I did when I worked. Being busy helps anxiety.  But feels it is time for HRT.  Wants to talk about it.   5 days per month horrible with dep, anxiety , paranoid.  More OC gotten bad.   Brain fog chronically.  Has to make lists. Disc health risks HRT.  She is waying this against QOL issues. No med changes  12/18/23 appt noted:  Pscyh meds:  Wellbutrin  SR 150 AM ,  Tegretol -XR 100 mg tablets 1 in the morning #30 and 1 refill and Tegretol -XR 200 mg tablets 1 in the morning and 2 at night #90 and 1 refill.  she is also to take short acting carbamazepine  100 mg tablets 2 at night. Continue lamotrigine  100 mg at noon and 200 mg at night for bipolar depression.   Sertraline  50 mg prn PMDD avg 5 days/month  risperidone  0.5 mg prn paranoia and obsessive thinking hormonally to use prn.  Needs more relief. Xanax  0.5 mg HS 34 days of 39 days were good.  Never  needed risperidone .  Taking only 1 sertraline  prn.   Started HRT in June and increased dose in Sept.  Made a huge difference.   No new modd concerns.  Feels sertraline  is available.  No sig OC sx. Prn alprazolam  for anxiety is helpful.  Feels better physciall and mentally with HRT.  Skin and hair is  better.   Really pleased.  Active.   6 hour sleep good with Xanax .  Past  Psychiatric Medication Trials:  Carbamazepine  900, lamotrigine  XR 200 mg twice daily,  Wellbutrin  SR 100 mg twice daily,  naltrexone, Abilify,  Trintellix, paroxetine weakness,  sertraline , other antidepressants stimulants caused mania,  alprazolam , Ambien ,  N-acetylcysteine,  topiramate,   She also has had a good response to the full dose of Equetro .  Review of Systems  Neurological:  Negative for seizures and weakness.  Psychiatric/Behavioral:  Negative for depression. The patient is not nervous/anxious.     Medications: I have reviewed the patient's current medications.  Current Outpatient Medications  Medication Sig Dispense Refill   bimatoprost (LATISSE) 0.03 % ophthalmic solution PLACE 1 DROP ON APPLICATOR & APPLY EVENLY AT THE BASE OF EYELASHES NIGHTLY ON BOTH EYES.     estradiol (VIVELLE-DOT) 0.075 MG/24HR Place 1 patch onto the skin 2 (two) times a week.     hydrocortisone  2.5 % cream Apply topically 2 (two) times daily as needed (Rash). Use for up to 1 week. Apply to eyelids for rash 3.5 g 0   levothyroxine (SYNTHROID, LEVOTHROID) 50 MCG tablet Take 1 tablet by mouth every morning.  5   progesterone (PROMETRIUM) 100 MG capsule Take 100 mg by mouth daily.     sertraline  (ZOLOFT ) 50 MG tablet TAKE 1 TABLET BY MOUTH DAILY 90 tablet 0   Tretinoin  (ALTRENO ) 0.05 % LOTN Apply 1 Application topically at bedtime. Qhs to face as tolerated 45 g 11   ALPRAZolam  (XANAX ) 0.5 MG tablet 1 twice daily as needed for anxiety and 1 nightly 90 tablet 5   buPROPion  (WELLBUTRIN  SR) 150 MG 12 hr tablet Take 1 tablet (150 mg total) by mouth daily. 90 tablet 0   carbamazepine  (TEGRETOL  XR) 100 MG 12 hr tablet Take 1 tablet (100 mg total) by mouth every morning. 90 tablet 0   carbamazepine  (TEGRETOL  XR) 200 MG 12 hr tablet TAKE 1 TABLET BY MOUTH EVERY MORNING AND TAKE 2 TABLETS BY MOUTH EVERY EVENING 90 tablet 1    carbamazepine  (TEGRETOL ) 100 MG chewable tablet Chew 2 tablets (200 mg total) by mouth at bedtime. 180 tablet 1   LamoTRIgine  300 MG TB24 24 hour tablet Take 1 tablet (300 mg total) by mouth at bedtime. 90 tablet 2   No current facility-administered medications for this visit.    Medication Side Effects: None no longer dizziness, nausea  Allergies: No Known Allergies  Past Medical History:  Diagnosis Date   Bipolar disorder (HCC)    Hypothyroidism    Melanoma (HCC)    Right back. 2021. Breslow's 1.37mm, Clark's level IV, mitotic index 6/mm2    Family History  Problem Relation Age of Onset   Anxiety disorder Father    Breast cancer Neg Hx     Social History   Socioeconomic History   Marital status: Married    Spouse name: Not on file   Number of children: Not on file   Years of education: Not on file   Highest education level: Not on file  Occupational History   Not on file  Tobacco Use   Smoking status: Never   Smokeless tobacco: Never  Substance and Sexual Activity   Alcohol use: No   Drug use: No   Sexual activity: Not on file  Other Topics Concern   Not on file  Social History Narrative   Not on file   Social Drivers of Health   Financial Resource Strain: Low Risk  (11/02/2023)   Received from [facility]   Overall Financial Resource Strain (  CARDIA)    Difficulty of Paying Living Expenses: Not hard at all  Food Insecurity: No Food Insecurity (11/02/2023)   Received from [facility]   Hunger Vital Sign    Within the past 12 months, you worried that your food would run out before you got the money to buy more.: Never true    Within the past 12 months, the food you bought just didn't last and you didn't have money to get more.: Never true  Transportation Needs: No Transportation Needs (11/02/2023)   Received from [facility] - Transportation    In the past 12 months, has lack of transportation  kept you from medical appointments or from getting medications?: No    Lack of Transportation (Non-Medical): No  Physical Activity: Not on file  Stress: Not on file  Social Connections: Not on file  Intimate Partner Violence: Not on file    Past Medical History, Surgical history, Social history, and Family history were reviewed and updated as appropriate.   Please see review of systems for further details on the patient's review from today.   Objective:   Physical Exam:  There were no vitals taken for this visit.  Physical Exam Constitutional:      General: She is not in acute distress.    Appearance: She is well-developed.  Musculoskeletal:        General: No deformity.  Neurological:     Mental Status: She is alert and oriented to person, place, and time.     Cranial Nerves: No dysarthria.     Coordination: Coordination normal.  Psychiatric:        Attention and Perception: Attention normal. She does not perceive auditory hallucinations.        Mood and Affect: Mood is anxious. Mood is not depressed. Affect is not labile, angry, tearful or inappropriate.        Speech: Speech normal.        Behavior: Behavior normal. Behavior is cooperative.        Thought Content: Thought content normal. Thought content is not paranoid or delusional. Thought content does not include homicidal or suicidal ideation. Thought content does not include suicidal plan.        Cognition and Memory: Cognition and memory normal.        Judgment: Judgment normal.     Comments: Insight good.   Episodic health obsessions but much better mood and more consistency with HRT      Lab Review:  No results found for: NA, K, CL, CO2, GLUCOSE, BUN, CREATININE, CALCIUM, PROT, ALBUMIN, AST, ALT, ALKPHOS, BILITOT, GFRNONAA, GFRAA     Component Value Date/Time   WBC 16.2 (H) 10/29/2007 0505   RBC 2.70 (L) 10/29/2007 0505   HGB 8.7 DELTA CHECK NOTED (L) 10/29/2007 0505   HCT  25.9 (L) 10/29/2007 0505   PLT 183 10/29/2007 0505   MCV 95.8 10/29/2007 0505   MCHC 33.6 10/29/2007 0505   RDW 14.8 10/29/2007 0505    No results found for: POCLITH, LITHIUM   Lab Results  Component Value Date   CBMZ 11.1 11/09/2017   CBMZ 10/24/22 10.3  .res Assessment: Plan:    Marasia was seen today for follow-up, depression and anxiety.  Diagnoses and all orders for this visit:  Bipolar II disorder (HCC) -     buPROPion  (WELLBUTRIN  SR) 150 MG 12 hr tablet; Take 1 tablet (150 mg total) by mouth daily. -  carbamazepine  (TEGRETOL  XR) 200 MG 12 hr tablet; TAKE 1 TABLET BY MOUTH EVERY MORNING AND TAKE 2 TABLETS BY MOUTH EVERY EVENING -     carbamazepine  (TEGRETOL ) 100 MG chewable tablet; Chew 2 tablets (200 mg total) by mouth at bedtime. -     carbamazepine  (TEGRETOL  XR) 100 MG 12 hr tablet; Take 1 tablet (100 mg total) by mouth every morning. -     LamoTRIgine  300 MG TB24 24 hour tablet; Take 1 tablet (300 mg total) by mouth at bedtime.  Generalized anxiety disorder -     ALPRAZolam  (XANAX ) 0.5 MG tablet; 1 twice daily as needed for anxiety and 1 nightly  PMDD (premenstrual dysphoric disorder)  OCD - safety and infestation focused  Insomnia due to mental condition       appt We discussed Patient was stable on 900 mg of Equetro  for an extended period of time with the exception of PMDD.  However she eventually started developing side effects of dizziness and nausea and vomiting and could no longer tolerate that dosage.  The dosage was cut back to 600 mg and she has subsequently developed a mixture of obsessive-compulsive and irritable hypomanic symptoms.  She was having unusual intrusive obsessive thoughts which were about lice and then about Covid.  She had been successful at transitioning from Equetro  to a mixture of immediate release and extended release carbamazepine .  She was tolerating the 900 mg daily which had markedly reduced the intrusive obsessive  thoughts about Covid and other obsessive anxious thoughts.  It had helped to reduce the dysphoric mixed mood symptoms.    In recent months the primary problem has been severe PMDD associated with depression, severe anxiety and obsessing on unrealistic dangers to the point that she describes herself as being paranoid.  She has discussed this with her primary care doctor who has agreed to address this problem using hormone replacement therapy as psychiatric medicines have been ineffective or not effective enough up to this point. Dramatic improvement with HRT  Disc DDI CBZ with HRT and may need higher doses of HRT to get benefit.  Disc  CBMZ 10/24/22 10.3.  and it's meaning and significance.  No indication to change dosing.    Patient's insurance would not pay for Tegretol  XR 100 mg tablets 3 in the morning and 4 at night because of quantity limits.   Therefore the prescription was rewritten Tegretol -XR 100 mg tablets 1 in the morning #30 and 1 refill and Tegretol -XR 200 mg tablets 1 in the morning and 2 at night #90 and 1 refill.  she is also to take short acting carbamazepine  100 mg tablets 2 at night.  Switched lamotrigine  to ER 300 mg at night for bipolar depression bc marked SE taking IR with dizziness, NV  Continue the Wellbutrin  SR 150 mg in the morning as she feels that is helpful for energy and appetite control.  And somewhat for attention and focus.  Continue alternating Xanax  and Ambien  at night but is also taking it prn for anxiety.  I have okayed number 45/month  Sertraline  50 mg prn PMDD.  Use prn has partiallly worked   No risperidone  needed.  She is taking an unusual mix between long-acting and short acting carbamazepine  in order to achieve maximum mood benefit but avoid side effects of nausea and dizziness without this combination and also dictated by insurance limitations.  We discussed the short-term risks associated with benzodiazepines including sedation and increased fall  risk among others.  Discussed  long-term side effect risk including dependence, potential withdrawal symptoms, and the potential eventual dose-related risk of dementia.  But recent studies from 2020 dispute this association between benzodiazepines and dementia risk. Newer studies in 2020 do not support an association with dementia.  FU 6 mos  Lorene Macintosh, MD, DFAPA   Please see After Visit Summary for patient specific instructions.   Future Appointments  Date Time Provider Department Center  02/07/2024  8:00 AM [facility]  06/17/2024  1:00 PM Cottle, Lorene KANDICE Raddle., MD [facility] None  12/11/2024  8:15 AM Hester Alm BROCKS, MD [facility] None    No orders of the defined types were placed in this encounter.      -------------------------------

## 2024-02-06 ENCOUNTER — Other Ambulatory Visit: Payer: Self-pay | Admitting: Psychiatry

## 2024-02-06 DIAGNOSIS — F3181 Bipolar II disorder: Secondary | ICD-10-CM

## 2024-02-07 ENCOUNTER — Ambulatory Visit
Admission: RE | Admit: 2024-02-07 | Discharge: 2024-02-07 | Disposition: A | Discharge status: Home / Self Care | Source: Ambulatory Visit | Attending: Internal Medicine | Admitting: Internal Medicine

## 2024-02-07 DIAGNOSIS — Z1231 Encounter for screening mammogram for malignant neoplasm of breast: Secondary | ICD-10-CM | POA: Diagnosis present

## 2024-06-17 ENCOUNTER — Ambulatory Visit: Admitting: Psychiatry

## 2024-12-11 ENCOUNTER — Ambulatory Visit

## 2024-12-11 ENCOUNTER — Ambulatory Visit: Admitting: Dermatology
